# Patient Record
Sex: Male | Born: 1995 | Hispanic: Refuse to answer | Marital: Single
Health system: Midwestern US, Community
[De-identification: ages and names within clinical notes are randomized; demographics above are authoritative.]

## PROBLEM LIST (undated history)

## (undated) DIAGNOSIS — K219 Gastro-esophageal reflux disease without esophagitis: Secondary | ICD-10-CM

## (undated) DIAGNOSIS — K439 Ventral hernia without obstruction or gangrene: Secondary | ICD-10-CM

## (undated) DIAGNOSIS — J302 Other seasonal allergic rhinitis: Secondary | ICD-10-CM

## (undated) HISTORY — DX: Ventral hernia without obstruction or gangrene: K43.9

---

## 2018-05-23 ENCOUNTER — Inpatient Hospital Stay
Admit: 2018-05-23 | Discharge: 2018-05-23 | Disposition: A | Discharge status: Home / Self Care | Payer: Self-pay | Attending: Emergency Medicine

## 2018-05-23 DIAGNOSIS — J039 Acute tonsillitis, unspecified: Secondary | ICD-10-CM

## 2018-05-23 LAB — STREP AG SCREEN, GROUP A
Group A Strep Ag ID: NEGATIVE
Strep A Ag: NEGATIVE

## 2018-05-23 MED ORDER — AMOXICILLIN 875 MG TAB
875 mg | ORAL_TABLET | Freq: Two times a day (BID) | ORAL | 0 refills | Status: AC
Start: 2018-05-23 — End: 2018-06-02

## 2018-05-23 MED ORDER — IBUPROFEN 600 MG TAB
600 mg | ORAL_TABLET | Freq: Four times a day (QID) | ORAL | 0 refills | Status: AC | PRN
Start: 2018-05-23 — End: ?

## 2018-05-23 MED ORDER — DEXAMETHASONE SODIUM PHOSPHATE 10 MG/ML IJ SOLN
10 mg/mL | Freq: Once | INTRAMUSCULAR | Status: AC
Start: 2018-05-23 — End: 2018-05-23
  Administered 2018-05-23: 19:00:00 via ORAL

## 2018-05-23 MED FILL — DEXAMETHASONE SODIUM PHOSPHATE 10 MG/ML IJ SOLN: 10 mg/mL | INTRAMUSCULAR | Qty: 1

## 2018-05-23 NOTE — ED Notes (Signed)
 Patient presents to ED with c/o sore throat for few days. Patient noted to have white pustule to bilateral tonsils. Patient is alert and oriented x 4 and in no acute distress at this time.  Respirations are at a regular rate, depth, and pattern.  Patient updated on plan of care and has no questions or concerns at this time.  Call bell within reach.  Will continue to monitor.  Please reference nursing assessment.         Emergency Department Nursing Plan of Care       The Nursing Plan of Care is developed from the Nursing assessment and Emergency Department Attending provider initial evaluation.  The plan of care may be reviewed in the "ED Provider note".    The Plan of Care was developed with the following considerations:   Patient / Family readiness to learn indicated ab:czmajopszi understanding and successful return demonstration  Persons(s) to be included in education: patient  Barriers to Learning/Limitations:No    Signed     Dionna CHRISTELLA Batter, RN    05/23/2018   1:00 PM

## 2018-05-23 NOTE — ED Notes (Signed)
Reports sore throat x few days

## 2018-05-23 NOTE — ED Provider Notes (Signed)
ED Provider Notes by Ellsworth LennoxNardella, Deondrick Searls M, NP at 05/23/18 1335                Author: Ellsworth LennoxNardella, Helio Lack M, NP  Service: Emergency Medicine  Author Type: Nurse Practitioner       Filed: 05/24/18 1503  Date of Service: 05/23/18 1335  Status: Attested           Editor: Ellsworth LennoxNardella, Toula Miyasaki M, NP (Nurse Practitioner)  Cosigner: Jule SerStratton, Dwayne E, MD at 05/25/18 1044          Attestation signed by Jule SerStratton, Dwayne E, MD at 05/25/18 1044          10:44 AM   I was personally available for consultation in the emergency department.  I have reviewed the chart and agree with the documentation recorded by the APP, including the assessment, treatment plan, and disposition.   Jule Serwayne E Stratton, MD                                    EMERGENCY DEPARTMENT HISTORY AND PHYSICAL EXAM      Date: 05/23/2018   Patient Name: Paul Gill        History of Presenting Illness          Chief Complaint       Patient presents with        ?  Sore Throat              History Provided By: Patient      Chief Complaint: sore throat   Duration: 3 Days   Timing:  Acute   Location: tonsils back of throat   Quality: irritating sensation   Severity: 5 out of 10   Modifying Factors: swallowing worsens pain   Associated Symptoms: denies any other associated signs or symptoms         HPI: Paul Gill is a  22 y.o. male with a PMH of No significant  past medical history who presents with throat acute onset 3 days ago.  Patient states he has white pustules on his tonsils.  Patient denies history of strep throat.  Patient states is painful to swallow.   Patient denies fever cough ear pain runny nose.  He denies recent sick contacts.  He has no other complaints at this time.       PCP: None        Current Outpatient Medications          Medication  Sig  Dispense  Refill           ?  ibuprofen (MOTRIN) 600 mg tablet  Take 1 Tab by mouth every six (6) hours as needed for Pain.  20 Tab  0           ?  amoxicillin (AMOXIL) 875 mg tablet  Take 1 Tab by mouth  two (2) times a day for 10 days.  20 Tab  0             Past History        Past Medical History:   No past medical history on file.      Past Surgical History:   No past surgical history on file.      Family History:   No family history on file.      Social History:     Social History  Tobacco Use         ?  Smoking status:  Not on file       Substance Use Topics         ?  Alcohol use:  Not on file         ?  Drug use:  Not on file           Allergies:   No Known Allergies           Review of Systems     Review of Systems    Constitutional: Negative for chills, fatigue and fever.    HENT: Positive for sore throat. Negative for congestion, drooling, trouble swallowing and voice change.     Eyes: Negative for redness.    Respiratory: Negative for cough, chest tightness and wheezing.     Cardiovascular: Negative for chest pain.    Gastrointestinal: Negative for abdominal pain.    Genitourinary: Negative for dysuria.    Musculoskeletal: Negative for arthralgias and back pain.    Skin: Negative for rash.    Neurological: Negative for dizziness, syncope, weakness, light-headedness, numbness and headaches.    Hematological: Negative for adenopathy.    All other systems reviewed and are negative.           Physical Exam          Vitals:          05/23/18 1223        BP:  140/78     Pulse:  79     Resp:  16     Temp:  98.7 ??F (37.1 ??C)     SpO2:  100%     Weight:  122.5 kg (270 lb)        Height:  5\' 8"  (1.727 m)        Physical Exam   Vitals signs and nursing note reviewed.   Constitutional:        Appearance: He is well-developed.   HENT :       Head: Normocephalic and atraumatic.      Right Ear: External ear normal.      Mouth/Throat:      Pharynx: Uvula midline.  Oropharyngeal exudate and posterior oropharyngeal erythema present.      Comments:  bilateral tonsillar exudate tonsils enlarged   Eyes:       General:         Right eye: No discharge.         Left eye: No discharge.      Conjunctiva/sclera:  Conjunctivae normal.    Neck:       Musculoskeletal: Normal range of motion and neck supple.   Cardiovascular :       Rate and Rhythm: Normal rate and regular rhythm.      Heart sounds: Normal heart sounds.    Pulmonary:       Effort: Pulmonary effort is normal. No respiratory distress.      Breath sounds: Normal breath sounds. No wheezing.    Abdominal:      General: Bowel sounds are normal.      Palpations: Abdomen is soft.      Tenderness: There is no tenderness.     Musculoskeletal: Normal range of motion.     Lymphadenopathy:       Cervical: No cervical adenopathy.   Skin :      General: Skin is warm and dry.   Neurological :       Mental  Status: He is alert and oriented to person, place, and time.      Cranial Nerves: No cranial nerve deficit.    Psychiatric:         Behavior: Behavior normal.         Thought Content: Thought content normal.         Judgment: Judgment normal.                  Diagnostic Study Results        Labs -    No results found for this or any previous visit (from the past 12 hour(s)).      Radiologic Studies -      No orders to display          CT Results  (Last 48 hours)          None                 CXR Results  (Last 48  hours)          None                       Medical Decision Making     I am the first provider for this patient.      I reviewed the vital signs, available nursing notes, past medical history, past surgical history, family history and social history.      Vital Signs-Reviewed the patient's vital signs.      Records Reviewed: Nursing Notes                 Disposition:   home      DISCHARGE NOTE:            Care plan outlined and precautions discussed.  Patient has no new complaints, changes, or physical findings.  . All medications were reviewed with the patient; will d/c home with PCP. All of pt's questions and concerns were addressed. Patient was instructed  and agrees to follow up with PCP, as well as to return to the ED upon further deterioration. Patient is ready  to go home.        Follow-up Information               Follow up With  Specialties  Details  Why  Contact Info              [facility]    In 1 week  If symptoms worsen  [facility] - [facility] Building   [address]   [place] [address]   [phone]                  Discharge Medication List as of 05/23/2018  1:25 PM              START taking these medications          Details        ibuprofen (MOTRIN) 600 mg tablet  Take 1 Tab by mouth every six (6) hours as needed for Pain., Normal, Disp-20 Tab, R-0               amoxicillin (AMOXIL) 875 mg tablet  Take 1 Tab by mouth two (2) times a day for 10 days., Normal, Disp-20 Tab, R-0                         Provider Notes (  Medical Decision Making):    DDX tonsillitis tonsillitis strep pharyngitis peritonsillar abscess   Procedures:   Procedures      Please note that this dictation was completed with Dragon, computer voice recognition software.  Quite often unanticipated grammatical, syntax, homophones, and other interpretive errors are inadvertently transcribed by the computer software.  Please disregard  these errors.  Additionally, please excuse any errors that have escaped final proofreading.        Diagnosis        Clinical Impression:       1.  Acute tonsillitis, unspecified etiology

## 2018-05-23 NOTE — ED Provider Notes (Signed)
EMERGENCY DEPARTMENT HISTORY AND PHYSICAL EXAM    Date: 05/23/2018  Patient Name: Paul Gill    History of Presenting Illness     Chief Complaint   Patient presents with   ??? Sore Throat         History Provided By: Patient    Chief Complaint: sore throat  Duration: 3 Days  Timing:  Acute  Location: tonsils back of throat  Quality: irritating sensation  Severity: 5 out of 10  Modifying Factors: swallowing worsens pain  Associated Symptoms: denies any other associated signs or symptoms      HPI: Paul Gill is a 22 y.o. male with a PMH of No significant past medical history who presents with throat acute onset 3 days ago.  Patient states he has white pustules on his tonsils.  Patient denies history of strep throat.  Patient states is painful to swallow.  Patient denies fever cough ear pain runny nose.  He denies recent sick contacts.  He has no other complaints at this time.     PCP: None    Current Outpatient Medications   Medication Sig Dispense Refill   ??? ibuprofen (MOTRIN) 600 mg tablet Take 1 Tab by mouth every six (6) hours as needed for Pain. 20 Tab 0   ??? amoxicillin (AMOXIL) 875 mg tablet Take 1 Tab by mouth two (2) times a day for 10 days. 20 Tab 0       Past History     Past Medical History:  No past medical history on file.    Past Surgical History:  No past surgical history on file.    Family History:  No family history on file.    Social History:  Social History     Tobacco Use   ??? Smoking status: Not on file   Substance Use Topics   ??? Alcohol use: Not on file   ??? Drug use: Not on file       Allergies:  No Known Allergies      Review of Systems   Review of Systems   Constitutional: Negative for chills, fatigue and fever.   HENT: Positive for sore throat. Negative for congestion, drooling, trouble swallowing and voice change.    Eyes: Negative for redness.   Respiratory: Negative for cough, chest tightness and wheezing.    Cardiovascular: Negative for chest pain.    Gastrointestinal: Negative for abdominal pain.   Genitourinary: Negative for dysuria.   Musculoskeletal: Negative for arthralgias and back pain.   Skin: Negative for rash.   Neurological: Negative for dizziness, syncope, weakness, light-headedness, numbness and headaches.   Hematological: Negative for adenopathy.   All other systems reviewed and are negative.      Physical Exam     Vitals:    05/23/18 1223   BP: 140/78   Pulse: 79   Resp: 16   Temp: 98.7 ??F (37.1 ??C)   SpO2: 100%   Weight: 122.5 kg (270 lb)   Height: 5\' 8"  (1.727 m)     Physical Exam  Vitals signs and nursing note reviewed.   Constitutional:       Appearance: He is well-developed.   HENT:      Head: Normocephalic and atraumatic.      Right Ear: External ear normal.      Mouth/Throat:      Pharynx: Uvula midline. Oropharyngeal exudate and posterior oropharyngeal erythema present.      Comments: bilateral tonsillar exudate tonsils enlarged  Eyes:  General:         Right eye: No discharge.         Left eye: No discharge.      Conjunctiva/sclera: Conjunctivae normal.   Neck:      Musculoskeletal: Normal range of motion and neck supple.   Cardiovascular:      Rate and Rhythm: Normal rate and regular rhythm.      Heart sounds: Normal heart sounds.   Pulmonary:      Effort: Pulmonary effort is normal. No respiratory distress.      Breath sounds: Normal breath sounds. No wheezing.   Abdominal:      General: Bowel sounds are normal.      Palpations: Abdomen is soft.      Tenderness: There is no tenderness.   Musculoskeletal: Normal range of motion.   Lymphadenopathy:      Cervical: No cervical adenopathy.   Skin:     General: Skin is warm and dry.   Neurological:      Mental Status: He is alert and oriented to person, place, and time.      Cranial Nerves: No cranial nerve deficit.   Psychiatric:         Behavior: Behavior normal.         Thought Content: Thought content normal.         Judgment: Judgment normal.           Diagnostic Study Results      Labs -   No results found for this or any previous visit (from the past 12 hour(s)).    Radiologic Studies -   No orders to display     CT Results  (Last 48 hours)    None        CXR Results  (Last 48 hours)    None            Medical Decision Making   I am the first provider for this patient.    I reviewed the vital signs, available nursing notes, past medical history, past surgical history, family history and social history.    Vital Signs-Reviewed the patient's vital signs.    Records Reviewed: Nursing Notes            Disposition:  home    DISCHARGE NOTE:         Care plan outlined and precautions discussed.  Patient has no new complaints, changes, or physical findings.  . All medications were reviewed with the patient; will d/c home with PCP. All of pt's questions and concerns were addressed. Patient was instructed and agrees to follow up with PCP, as well as to return to the ED upon further deterioration. Patient is ready to go home.    Follow-up Information     Follow up With Specialties Details Why Contact Info    [facility]  In 1 week If symptoms worsen [facility]  [address], [address]  [place] [id number]  [phone]          Discharge Medication List as of 05/23/2018  1:25 PM      START taking these medications    Details   ibuprofen (MOTRIN) 600 mg tablet Take 1 Tab by mouth every six (6) hours as needed for Pain., Normal, Disp-20 Tab, R-0      amoxicillin (AMOXIL) 875 mg tablet Take 1 Tab by mouth two (2) times a day for 10 days., Normal, Disp-20  Tab, R-0             Provider Notes (Medical Decision Making):   DDX tonsillitis tonsillitis strep pharyngitis peritonsillar abscess  Procedures:  Procedures    Please note that this dictation was completed with Dragon, computer voice recognition software.  Quite often unanticipated grammatical, syntax, homophones, and other interpretive errors are inadvertently transcribed by  the computer software.  Please disregard these errors.  Additionally, please excuse any errors that have escaped final proofreading.    Diagnosis     Clinical Impression:   1. Acute tonsillitis, unspecified etiology

## 2018-05-23 NOTE — ED Notes (Signed)
Patient presents to ED with c/o sore throat for few days. Patient noted to have white pustule to bilateral tonsils. Patient is alert and oriented x 4 and in no acute distress at this time.  Respirations are at a regular rate, depth, and pattern.  Patient updated on plan of care and has no questions or concerns at this time.  Call bell within reach.  Will continue to monitor.  Please reference nursing assessment.         Emergency Department Nursing Plan of Care       The Nursing Plan of Care is developed from the Nursing assessment and Emergency Department Attending provider initial evaluation.  The plan of care may be reviewed in the ???ED Provider note???.    The Plan of Care was developed with the following considerations:   Patient / Family readiness to learn indicated by:verbalized understanding and successful return demonstration  Persons(s) to be included in education: patient  Barriers to Learning/Limitations:No    Signed     Dionna M Coleman, RN    05/23/2018   1:00 PM

## 2018-05-23 NOTE — ED Triage Notes (Signed)
Reports sore throat x few days

## 2018-05-25 LAB — CULTURE, THROAT
Culture result:: NORMAL
Culture: NORMAL

## 2019-01-13 ENCOUNTER — Encounter: Attending: Family

## 2020-07-14 ENCOUNTER — Ambulatory Visit (HOSPITAL_COMMUNITY)
Admission: EM | Admit: 2020-07-14 | Discharge: 2020-07-14 | Disposition: A | Discharge status: Home / Self Care | Payer: Medicaid - Out of State | Attending: Medical Oncology | Admitting: Medical Oncology

## 2020-07-14 ENCOUNTER — Other Ambulatory Visit: Payer: Self-pay

## 2020-07-14 ENCOUNTER — Ambulatory Visit (INDEPENDENT_AMBULATORY_CARE_PROVIDER_SITE_OTHER): Payer: Medicaid - Out of State

## 2020-07-14 ENCOUNTER — Encounter (HOSPITAL_COMMUNITY): Payer: Self-pay

## 2020-07-14 DIAGNOSIS — U071 COVID-19: Secondary | ICD-10-CM | POA: Diagnosis not present

## 2020-07-14 DIAGNOSIS — R509 Fever, unspecified: Secondary | ICD-10-CM

## 2020-07-14 DIAGNOSIS — R058 Other specified cough: Secondary | ICD-10-CM | POA: Diagnosis not present

## 2020-07-14 MED ORDER — AMOXICILLIN-POT CLAVULANATE 875-125 MG PO TABS: 1.0000 | ORAL_TABLET | Freq: Two times a day (BID) | ORAL | 0 refills | Status: DC

## 2020-07-14 NOTE — ED Triage Notes (Addendum)
Pt in with c/o cough and excessive mucus production that has been going on for about 2 weeks. Also c/o body aches and fatigue. Pt tested positive for covid on   Also c/o nausea when he wakes up in the morning  Pt has been taking theraflu with no relief

## 2020-07-14 NOTE — ED Provider Notes (Addendum)
[facility]    CSN: [id number] Arrival date & time: 07/14/20  0932      History   Chief Complaint Chief Complaint  Patient presents with  . Cough  . covid + on 2/11    HPI Lance Jensen is a 25 y.o. male.   HPI   Cough: Pt reports that he began having COVID symptoms on 07/03/2020. He tested positive on 07/07/2020. Since then his symptoms initially improved and then recently over the pas few days have  worsened. He has had a productive cough with occasional speckled hemoptysis events, body aches, fever, cough. He denies chest pains, SOB, calf pain. He has been using theraflu with no relief.   History reviewed. No pertinent past medical history.  There are no problems to display for this patient.   History reviewed. No pertinent surgical history.   Home Medications    Prior to Admission medications   Medication Sig Start Date End Date Taking? Authorizing Provider  amoxicillin-clavulanate (AUGMENTIN) 875-125 MG tablet Take 1 tablet by mouth every 12 (twelve) hours. 07/14/20  Yes Rushie Chestnut, PA-C    Family History History reviewed. No pertinent family history.  Social History Social History   Tobacco Use  . Smoking status: Current Every Day Smoker  . Smokeless tobacco: Never Used  Substance Use Topics  . Drug use: Yes    Types: Marijuana     Allergies   Patient has no known allergies.   Review of Systems Review of Systems  As stated above in HPI Physical Exam Triage Vital Signs ED Triage Vitals  Enc Vitals Group     BP 07/14/20 0954 (!) 151/89     Pulse Rate 07/14/20 0954 77     Resp 07/14/20 0954 19     Temp 07/14/20 0954 99.5 F (37.5 C)     Temp src --      SpO2 07/14/20 0954 95 %     Weight --      Height --      Head Circumference --      Peak Flow --      Pain Score 07/14/20 0950 0     Pain Loc --      Pain Edu? --      Excl. in GC? --    No data found.  Updated Vital Signs BP (!) 151/89   Pulse 77   Temp 99.5 F  (37.5 C)   Resp 19   SpO2 95%   Physical Exam Vitals and nursing note reviewed.  Constitutional:      General: He is not in acute distress.    Appearance: Normal appearance. He is obese. He is not ill-appearing, toxic-appearing or diaphoretic.  HENT:     Head: Normocephalic.     Nose: Nose normal. No congestion or rhinorrhea.     Mouth/Throat:     Mouth: Mucous membranes are moist.     Pharynx: Oropharynx is clear. No oropharyngeal exudate or posterior oropharyngeal erythema.  Eyes:     Pupils: Pupils are equal, round, and reactive to light.  Cardiovascular:     Rate and Rhythm: Normal rate and regular rhythm.     Heart sounds: Normal heart sounds.  Pulmonary:     Effort: Pulmonary effort is normal. No respiratory distress.     Breath sounds: Normal breath sounds. No stridor. No wheezing, rhonchi or rales.  Chest:     Chest wall: No tenderness.  Musculoskeletal:     Cervical back: Normal  range of motion.  Lymphadenopathy:     Cervical: No cervical adenopathy.  Neurological:     Mental Status: He is alert.      UC Treatments / Results  Labs (all labs ordered are listed, but only abnormal results are displayed) Labs Reviewed - No data to display  EKG   Radiology DG Chest 2 View  Result Date: 07/14/2020 CLINICAL DATA:  Productive cough, fever. EXAM: CHEST - 2 VIEW COMPARISON:  None. FINDINGS: The heart size and mediastinal contours are within normal limits. Both lungs are clear. The visualized skeletal structures are unremarkable. IMPRESSION: No active cardiopulmonary disease. Electronically Signed   By: Lupita Raider M.D.   On: 07/14/2020 11:26    Procedures Procedures (including critical care time)  Medications Ordered in UC Medications - No data to display  Initial Impression / Assessment and Plan / UC Course  I have reviewed the triage vital signs and the nursing notes.  Pertinent labs & imaging results that were available during my care of the patient  were reviewed by me and considered in my medical decision making (see chart for details).     New. Chest x ray pending.   Update: Initial review of x ray suggests CAP. Treating with Augmentin.  Discussed how to take along with common potential side effects and precautions.  We reviewed red flag symptoms along with O2 monitoring.  Update: Official read does not suggest pneumonia however with his symptoms I am concerned about this as a possibility along with the finding on x ray in the [facility]. Treating with Augmentin.    Final Clinical Impressions(s) / UC Diagnoses   Final diagnoses:  Productive cough  COVID   Discharge Instructions   None    ED Prescriptions    Medication Sig Dispense Auth. Provider   amoxicillin-clavulanate (AUGMENTIN) 875-125 MG tablet Take 1 tablet by mouth every 12 (twelve) hours. 14 tablet Rushie Chestnut, [place]     PDMP not reviewed this encounter.   Rushie Chestnut, Cordelia Poche 07/14/20 1129    Rushie Chestnut, PA-C 07/14/20 1130

## 2020-12-06 ENCOUNTER — Other Ambulatory Visit: Payer: Self-pay

## 2020-12-06 ENCOUNTER — Encounter (HOSPITAL_COMMUNITY): Payer: Self-pay

## 2020-12-06 ENCOUNTER — Ambulatory Visit (HOSPITAL_COMMUNITY)
Admission: EM | Admit: 2020-12-06 | Discharge: 2020-12-06 | Disposition: A | Discharge status: Home / Self Care | Payer: Medicaid - Out of State | Attending: Family Medicine | Admitting: Family Medicine

## 2020-12-06 DIAGNOSIS — Z202 Contact with and (suspected) exposure to infections with a predominantly sexual mode of transmission: Secondary | ICD-10-CM

## 2020-12-06 DIAGNOSIS — Z113 Encounter for screening for infections with a predominantly sexual mode of transmission: Secondary | ICD-10-CM | POA: Diagnosis present

## 2020-12-06 HISTORY — DX: Other seasonal allergic rhinitis: J30.2

## 2020-12-06 LAB — HIV ANTIBODY (ROUTINE TESTING W REFLEX): HIV Screen 4th Generation wRfx: NONREACTIVE

## 2020-12-06 MED ORDER — METRONIDAZOLE 500 MG PO TABS: 2000.0000 mg | ORAL_TABLET | Freq: Once | ORAL | 0 refills | Status: AC

## 2020-12-06 NOTE — ED Provider Notes (Signed)
[facility]    CSN: [id number] Arrival date & time: 12/06/20  2671      History   Chief Complaint Chief Complaint  Patient presents with   Exposure to STD    HPI Lance Jensen is a 25 y.o. male.   Patient presenting today requesting STD screening.  He states his partner recently tested positive for trichomonas so he wanted to come in for treatment and to get tested for everything.  He denies any penile discharge, rashes, itching, dysuria, hematuria, pelvic pain, nausea vomiting diarrhea, fevers.   Past Medical History:  Diagnosis Date   Seasonal allergies     There are no problems to display for this patient.   History reviewed. No pertinent surgical history.     Home Medications    Prior to Admission medications   Medication Sig Start Date End Date Taking? Authorizing Provider  metroNIDAZOLE (FLAGYL) 500 MG tablet Take 4 tablets (2,000 mg total) by mouth once for 1 dose. 12/06/20 12/06/20 Yes Particia Nearing, PA-C  amoxicillin-clavulanate (AUGMENTIN) 875-125 MG tablet Take 1 tablet by mouth every 12 (twelve) hours. 07/14/20   Rushie Chestnut, PA-C    Family History Family History  Problem Relation Age of Onset   Healthy Mother     Social History Social History   Tobacco Use   Smoking status: Every Day    Pack years: 0.00   Smokeless tobacco: Never  Substance Use Topics   Drug use: Yes    Types: Marijuana     Allergies   Patient has no known allergies.   Review of Systems Review of Systems Per HPI  Physical Exam Triage Vital Signs ED Triage Vitals  Enc Vitals Group     BP 12/06/20 1002 (!) 150/86     Pulse Rate 12/06/20 1002 71     Resp 12/06/20 1002 20     Temp 12/06/20 1002 98.7 F (37.1 C)     Temp Source 12/06/20 1002 Oral     SpO2 12/06/20 1002 98 %     Weight --      Height --      Head Circumference --      Peak Flow --      Pain Score 12/06/20 1003 0     Pain Loc --      Pain Edu? --      Excl. in GC? --     No data found.  Updated Vital Signs BP (!) 150/86 (BP Location: Left Arm)   Pulse 71   Temp 98.7 F (37.1 C) (Oral)   Resp 20   SpO2 98%   Visual Acuity Right Eye Distance:   Left Eye Distance:   Bilateral Distance:    Right Eye Near:   Left Eye Near:    Bilateral Near:     Physical Exam Vitals and nursing note reviewed.  Constitutional:      Appearance: Normal appearance.  HENT:     Head: Atraumatic.  Eyes:     Extraocular Movements: Extraocular movements intact.     Conjunctiva/sclera: Conjunctivae normal.  Cardiovascular:     Rate and Rhythm: Normal rate and regular rhythm.  Pulmonary:     Effort: Pulmonary effort is normal.     Breath sounds: Normal breath sounds.  Genitourinary:    Comments: GU exam deferred, self swab performed Musculoskeletal:        General: Normal range of motion.     Cervical back: Normal range of motion and neck  supple.  Skin:    General: Skin is warm and dry.  Neurological:     General: No focal deficit present.     Mental Status: He is oriented to person, place, and time.  Psychiatric:        Mood and Affect: Mood normal.        Thought Content: Thought content normal.        Judgment: Judgment normal.     UC Treatments / Results  Labs (all labs ordered are listed, but only abnormal results are displayed) Labs Reviewed  HIV ANTIBODY (ROUTINE TESTING W REFLEX)  RPR  CYTOLOGY, (ORAL, ANAL, URETHRAL) ANCILLARY ONLY    EKG   Radiology No results found.  Procedures Procedures (including critical care time)  Medications Ordered in UC Medications - No data to display  Initial Impression / Assessment and Plan / UC Course  I have reviewed the triage vital signs and the nursing notes.  Pertinent labs & imaging results that were available during my care of the patient were reviewed by me and considered in my medical decision making (see chart for details).     Given the known exposure to trichomonas, will treat with  2 g once of metronidazole.  Discussed waiting at least 7 days for resume sexual contact after treatment completed.  Cytology swab and HIV, syphilis labs all pending, will treat based on need with these results.  Safe sexual practices reviewed.  Final Clinical Impressions(s) / UC Diagnoses   Final diagnoses:  STD exposure  Screen for STD (sexually transmitted disease)  Exposure to trichomonas   Discharge Instructions   None    ED Prescriptions     Medication Sig Dispense Auth. Provider   metroNIDAZOLE (FLAGYL) 500 MG tablet Take 4 tablets (2,000 mg total) by mouth once for 1 dose. 4 tablet Particia Nearing, [place]      PDMP not reviewed this encounter.   Particia Nearing, [place] 12/06/20 1159

## 2020-12-06 NOTE — ED Triage Notes (Signed)
Pt in requesting std testing  States his partner tested positive for trichomonas  Denies any penile sx

## 2020-12-07 LAB — CYTOLOGY, (ORAL, ANAL, URETHRAL) ANCILLARY ONLY
Chlamydia: NEGATIVE
Comment: NEGATIVE
Comment: NEGATIVE
Comment: NORMAL
Neisseria Gonorrhea: NEGATIVE
Trichomonas: NEGATIVE

## 2020-12-07 LAB — RPR: RPR Ser Ql: NONREACTIVE

## 2021-01-23 ENCOUNTER — Other Ambulatory Visit: Payer: Self-pay

## 2021-01-23 ENCOUNTER — Ambulatory Visit: Payer: Medicaid Other | Attending: Obstetrics

## 2021-01-23 DIAGNOSIS — Z315 Encounter for genetic counseling: Secondary | ICD-10-CM | POA: Diagnosis not present

## 2021-01-23 NOTE — Progress Notes (Signed)
Yurem Viner is the partner of Samantha Crimes, dob 06/17/1994, and was seen with her for genetic counseling today. 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  These are recessive conditions and therefore testing was made available to [place] to determine the chance for their current pregnancy to be affected with one of these conditions.  [employer] carrier screening was ordered today.  Results will be communicated to Chibueze (or Enrique Sack if he is not available per his request) in approximately 2 weeks.  I can be reached at [phone].  Cherly Anderson, MS, CGC

## 2021-02-08 ENCOUNTER — Telehealth: Payer: Self-pay | Admitting: Obstetrics and Gynecology

## 2021-02-08 NOTE — Telephone Encounter (Signed)
I spoke with Lance Jensen, dob Sep 14, 1995, with the results of his carrier screening which was ordered due to the finding that his partner, [name] (dob 06/17/1994), is silent carrier for alpha thalassemia and is at increased risk to be carrier for Spinal muscular atrophy.  [name]'s results showed that he is also a silent carrier for alpha thalassemia.  There is a 1 in 4 chance that this pregnancy will inherit the one missing gene from each parent and be a carrier for this conditions (a-/a-).  There is a 1 in 2 chance that the baby will be a silent carrier just like both parents (aa/a-) and a 1 in 4 chance that the baby will have normal copies of this gene (aa/aa).  While carriers may have low MCV  values, they are otherwise expected to have no symptoms related to their carrier status.  [name]'s testing for SMA was negative. He has a copy number of 3, which greatly reduces the chance that he is a carrier, from 1 in 72 to approximately 1 in 3,000.  Given the  1 in 34 chance that [name] is a carrier, this means a in 1 in 864,000 chance to have a baby with this condition.  His testing for changes in the Beta globin gene (for sickle cell disease and other variants) was normal.  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  We cannot determine if the R117H is inherited on the same chromosome (in cis) as the 7T or the 9T.  If he had a child who inherited another CF variant along with the R117H/7T, this could result in no symptoms, male infertility or CF-Related Metabolic syndrome. The R117H/9T combination is not expected to increase the risk for CF.  Because [name] was negative for changes in the CF gene (95% detection, 1 in 1,201 residual risk), the chance for pregnancies that this couple has together to inherit two variants in the CF gene is estimated to be 1 in 4,804. If he were to have a child with another partner in the  future, we would recommend carrier testing of his partner for this condition.  We can be reached at [phone].  Cherly Anderson, MS, CGC

## 2021-02-09 ENCOUNTER — Other Ambulatory Visit: Payer: Self-pay

## 2021-02-09 ENCOUNTER — Encounter (HOSPITAL_COMMUNITY): Payer: Self-pay | Admitting: Emergency Medicine

## 2021-02-09 ENCOUNTER — Ambulatory Visit (HOSPITAL_COMMUNITY)
Admission: EM | Admit: 2021-02-09 | Discharge: 2021-02-09 | Disposition: A | Discharge status: Home / Self Care | Payer: Medicaid - Out of State | Attending: Emergency Medicine | Admitting: Emergency Medicine

## 2021-02-09 DIAGNOSIS — B9689 Other specified bacterial agents as the cause of diseases classified elsewhere: Secondary | ICD-10-CM

## 2021-02-09 DIAGNOSIS — J019 Acute sinusitis, unspecified: Secondary | ICD-10-CM

## 2021-02-09 MED ORDER — AMOXICILLIN-POT CLAVULANATE 875-125 MG PO TABS: 1.0000 | ORAL_TABLET | Freq: Two times a day (BID) | ORAL | 0 refills | Status: DC

## 2021-02-09 NOTE — ED Provider Notes (Signed)
[facility]    CSN: [id number] Arrival date & time: 02/09/21  1248      History   Chief Complaint Chief Complaint  Patient presents with   Facial Pain   Nasal Congestion    HPI Lance Jensen is a 25 y.o. male.   Patient here for evaluation of facial pain and pressure that has been ongoing for the past week.  Reports using OTC medication such as Sudafed with minimal symptom relief.  Reports pressure is primarily on the right side.  Denies any recent sick contacts.  Denies any trauma, injury, or other precipitating event.  Denies any specific alleviating or aggravating factors.  Denies any fevers, chest pain, shortness of breath, N/V/D, numbness, tingling, weakness, abdominal pain, or headaches.    The history is provided by the patient.   Past Medical History:  Diagnosis Date   Seasonal allergies     There are no problems to display for this patient.   History reviewed. No pertinent surgical history.     Home Medications    Prior to Admission medications   Medication Sig Start Date End Date Taking? Authorizing Provider  amoxicillin-clavulanate (AUGMENTIN) 875-125 MG tablet Take 1 tablet by mouth every 12 (twelve) hours. 02/09/21  Yes Ivette Loyal, NP    Family History Family History  Problem Relation Age of Onset   Healthy Mother     Social History Social History   Tobacco Use   Smoking status: Every Day   Smokeless tobacco: Never  Substance Use Topics   Drug use: Yes    Types: Marijuana     Allergies   Patient has no known allergies.   Review of Systems Review of Systems  HENT:  Positive for congestion, sinus pressure and sinus pain.   All other systems reviewed and are negative.   Physical Exam Triage Vital Signs ED Triage Vitals  Enc Vitals Group     BP 02/09/21 1342 (!) 160/96     Pulse Rate 02/09/21 1342 65     Resp 02/09/21 1342 17     Temp 02/09/21 1342 98.9 F (37.2 C)     Temp Source 02/09/21 1342 Oral     SpO2  02/09/21 1342 95 %     Weight --      Height --      Head Circumference --      Peak Flow --      Pain Score 02/09/21 1341 5     Pain Loc --      Pain Edu? --      Excl. in GC? --    No data found.  Updated Vital Signs BP (!) 160/96 (BP Location: Left Wrist)   Pulse 65   Temp 98.9 F (37.2 C) (Oral)   Resp 17   SpO2 95%   Visual Acuity Right Eye Distance:   Left Eye Distance:   Bilateral Distance:    Right Eye Near:   Left Eye Near:    Bilateral Near:     Physical Exam Vitals and nursing note reviewed.  Constitutional:      General: He is not in acute distress.    Appearance: Normal appearance. He is not ill-appearing, toxic-appearing or diaphoretic.  HENT:     Head: Normocephalic and atraumatic.     Nose:     Right Turbinates: Swollen.     Left Turbinates: Swollen.     Right Sinus: Maxillary sinus tenderness and frontal sinus tenderness present.  Left Sinus: No maxillary sinus tenderness or frontal sinus tenderness.  Eyes:     Conjunctiva/sclera: Conjunctivae normal.  Cardiovascular:     Rate and Rhythm: Normal rate.     Pulses: Normal pulses.  Pulmonary:     Effort: Pulmonary effort is normal.  Abdominal:     General: Abdomen is flat.  Musculoskeletal:        General: Normal range of motion.     Cervical back: Normal range of motion.  Skin:    General: Skin is warm and dry.  Neurological:     General: No focal deficit present.     Mental Status: He is alert and oriented to person, place, and time.  Psychiatric:        Mood and Affect: Mood normal.     UC Treatments / Results  Labs (all labs ordered are listed, but only abnormal results are displayed) Labs Reviewed - No data to display  EKG   Radiology No results found.  Procedures Procedures (including critical care time)  Medications Ordered in UC Medications - No data to display  Initial Impression / Assessment and Plan / UC Course  I have reviewed the triage vital signs and the  nursing notes.  Pertinent labs & imaging results that were available during my care of the patient were reviewed by me and considered in my medical decision making (see chart for details).    Assessment negative for red flags or concerns.  Acute rhinosinusitis.  We will treat with Augmentin twice daily for the next 7 days.  May continue to use Sudafed as needed for congestion.  Recommend Flonase 1 to 2 sprays in each nostril daily.  Encourage fluids and rest.  Follow-up as needed Final Clinical Impressions(s) / UC Diagnoses   Final diagnoses:  Acute bacterial rhinosinusitis     Discharge Instructions      Take the augmentin 1 pill twice a day for the next 7 days.   You can take Tylenol and/or Ibuprofen as needed for pain relief and fever reduction.  You can use Flonase (fluticasone) 1-2 sprays in each nostril daily to help with congestion and sinus pressure.   Make sure you are drinking plenty of fluids, especially water, and rest.   Return or go to the Emergency Department if symptoms worsen or do not improve in the next few days.      ED Prescriptions     Medication Sig Dispense Auth. Provider   amoxicillin-clavulanate (AUGMENTIN) 875-125 MG tablet Take 1 tablet by mouth every 12 (twelve) hours. 14 tablet Ivette Loyal, NP      PDMP not reviewed this encounter.   Ivette Loyal, NP 02/09/21 1436

## 2021-02-09 NOTE — Discharge Instructions (Signed)
Take the augmentin 1 pill twice a day for the next 7 days.   You can take Tylenol and/or Ibuprofen as needed for pain relief and fever reduction.  You can use Flonase (fluticasone) 1-2 sprays in each nostril daily to help with congestion and sinus pressure.   Make sure you are drinking plenty of fluids, especially water, and rest.   Return or go to the Emergency Department if symptoms worsen or do not improve in the next few days.

## 2021-02-09 NOTE — ED Triage Notes (Signed)
Pt presents with facial pain and pressure and congestion xs 1 week.

## 2021-02-12 ENCOUNTER — Other Ambulatory Visit: Payer: Self-pay

## 2021-04-26 ENCOUNTER — Encounter (HOSPITAL_COMMUNITY): Payer: Self-pay | Admitting: Emergency Medicine

## 2021-04-26 ENCOUNTER — Emergency Department (HOSPITAL_COMMUNITY)
Admission: EM | Admit: 2021-04-26 | Discharge: 2021-04-26 | Disposition: A | Discharge status: Home / Self Care | Payer: Medicaid Other | Attending: Emergency Medicine | Admitting: Emergency Medicine

## 2021-04-26 DIAGNOSIS — J101 Influenza due to other identified influenza virus with other respiratory manifestations: Secondary | ICD-10-CM | POA: Insufficient documentation

## 2021-04-26 DIAGNOSIS — Z20822 Contact with and (suspected) exposure to covid-19: Secondary | ICD-10-CM | POA: Diagnosis not present

## 2021-04-26 DIAGNOSIS — Z5321 Procedure and treatment not carried out due to patient leaving prior to being seen by health care provider: Secondary | ICD-10-CM | POA: Diagnosis not present

## 2021-04-26 DIAGNOSIS — R059 Cough, unspecified: Secondary | ICD-10-CM | POA: Diagnosis present

## 2021-04-26 LAB — RESP PANEL BY RT-PCR (FLU A&B, COVID) ARPGX2
Influenza A by PCR: POSITIVE — AB
Influenza B by PCR: NEGATIVE
SARS Coronavirus 2 by RT PCR: NEGATIVE

## 2021-04-26 NOTE — ED Triage Notes (Signed)
Patient complains of productive cough and headache since last Friday. Patient also complains of right sided chest pain after coughing. Patient alert, oriented, and in no apparent distress at this time.

## 2021-04-29 ENCOUNTER — Ambulatory Visit (HOSPITAL_COMMUNITY)
Admission: EM | Admit: 2021-04-29 | Discharge: 2021-04-29 | Disposition: A | Discharge status: Home / Self Care | Payer: Medicaid Other | Attending: Student | Admitting: Student

## 2021-04-29 ENCOUNTER — Other Ambulatory Visit: Payer: Self-pay

## 2021-04-29 ENCOUNTER — Encounter (HOSPITAL_COMMUNITY): Payer: Self-pay | Admitting: Emergency Medicine

## 2021-04-29 DIAGNOSIS — J09X2 Influenza due to identified novel influenza A virus with other respiratory manifestations: Secondary | ICD-10-CM

## 2021-04-29 DIAGNOSIS — R0789 Other chest pain: Secondary | ICD-10-CM

## 2021-04-29 MED ORDER — TIZANIDINE HCL 2 MG PO TABS: 2.0000 mg | ORAL_TABLET | Freq: Three times a day (TID) | ORAL | 0 refills | Status: DC | PRN

## 2021-04-29 NOTE — ED Provider Notes (Signed)
[facility]    CSN: [id number] Arrival date & time: 04/29/21  1431      History   Chief Complaint Chief Complaint  Patient presents with   rib pain    HPI Lance Jensen is a 25 y.o. male presenting with R rib pain due to coughing. States he was diagnosed with influenza 1 week ago in the ED- didn't stay to be evaluated. R lateral rib pain with coughing and movement. Lingering nonproductive cough. Denies SOB, CP, dizziness, fevers/chills. Has attempted tylenol with minimal relief. Requesting work note.  HPI  Past Medical History:  Diagnosis Date   Seasonal allergies     There are no problems to display for this patient.   History reviewed. No pertinent surgical history.     Home Medications    Prior to Admission medications   Medication Sig Start Date End Date Taking? Authorizing Provider  tiZANidine (ZANAFLEX) 2 MG tablet Take 1 tablet (2 mg total) by mouth every 8 (eight) hours as needed for muscle spasms. 04/29/21  Yes Rhys Martini, PA-C    Family History Family History  Problem Relation Age of Onset   Healthy Mother     Social History Social History   Tobacco Use   Smoking status: Former    Types: Cigarettes   Smokeless tobacco: Never  Vaping Use   Vaping Use: Never used  Substance Use Topics   Alcohol use: Never   Drug use: Not Currently    Types: Marijuana     Allergies   Patient has no known allergies.   Review of Systems Review of Systems  Musculoskeletal:        R rib pain  All other systems reviewed and are negative.   Physical Exam Triage Vital Signs ED Triage Vitals  Enc Vitals Group     BP 04/29/21 1715 (!) 146/79     Pulse Rate 04/29/21 1715 98     Resp 04/29/21 1715 (!) 22     Temp 04/29/21 1715 98.1 F (36.7 C)     Temp Source 04/29/21 1715 Oral     SpO2 04/29/21 1715 95 %     Weight --      Height --      Head Circumference --      Peak Flow --      Pain Score 04/29/21 1711 6     Pain Loc --      Pain  Edu? --      Excl. in GC? --    No data found.  Updated Vital Signs BP (!) 146/79 (BP Location: Left Arm) Comment (BP Location): large cuff  Pulse 98   Temp 98.1 F (36.7 C) (Oral)   Resp (!) 22   SpO2 95%   Visual Acuity Right Eye Distance:   Left Eye Distance:   Bilateral Distance:    Right Eye Near:   Left Eye Near:    Bilateral Near:     Physical Exam Vitals reviewed.  Constitutional:      General: He is not in acute distress.    Appearance: Normal appearance. He is not ill-appearing.  HENT:     Head: Normocephalic and atraumatic.  Pulmonary:     Effort: Pulmonary effort is normal.     Breath sounds: No decreased breath sounds, wheezing, rhonchi or rales.     Comments: Breath sounds clear throughout  Musculoskeletal:     Comments: Exam limited due to body habitus. R lateral ribs-TTP without point tenderness  or effusion.   Neurological:     General: No focal deficit present.     Mental Status: He is alert and oriented to person, place, and time.  Psychiatric:        Mood and Affect: Mood normal.        Behavior: Behavior normal.        Thought Content: Thought content normal.        Judgment: Judgment normal.     UC Treatments / Results  Labs (all labs ordered are listed, but only abnormal results are displayed) Labs Reviewed - No data to display  EKG   Radiology No results found.  Procedures Procedures (including critical care time)  Medications Ordered in UC Medications - No data to display  Initial Impression / Assessment and Plan / UC Course  I have reviewed the triage vital signs and the nursing notes.  Pertinent labs & imaging results that were available during my care of the patient were reviewed by me and considered in my medical decision making (see chart for details).     This patient is a very pleasant 25 y.o. year old male presenting with MSK strain related to coughing. Reassuring exam. Today this pt is afebrile nontachycardic  nontachypneic, oxygenating well on room air, no wheezes rhonchi or rales.   Zanaflex sent. Trial of ibuprofen.   Work note provided. ED return precautions discussed. Patient verbalizes understanding and agreement.  .   Final Clinical Impressions(s) / UC Diagnoses   Final diagnoses:  Chest wall pain  Influenza due to identified novel influenza A virus with other respiratory manifestations     Discharge Instructions      -Start the muscle relaxer-Zanaflex (tizanidine), up to 3 times daily for muscle spasms and pain.  This can make you drowsy, so take at bedtime or when you do not need to drive or operate machinery. -You can take Tylenol up to 1000 mg 3 times daily, and ibuprofen up to 800 mg 3 times daily with food.  You can take these together, or alternate every 3-4 hours. -Heating pad -Continue over-the-counter medications for additional relief    ED Prescriptions     Medication Sig Dispense Auth. Provider   tiZANidine (ZANAFLEX) 2 MG tablet Take 1 tablet (2 mg total) by mouth every 8 (eight) hours as needed for muscle spasms. 21 tablet Rhys Martini, PA-C      PDMP not reviewed this encounter.   Rhys Martini, PA-C 04/29/21 1732

## 2021-04-29 NOTE — ED Triage Notes (Signed)
Right rib pain that started the weekend after thanks giving.  Patient went to ED on 04/26/2021.  Patient was diagnosed with influenza.  Patient did leave ED AMA due to family emergency

## 2021-04-29 NOTE — Discharge Instructions (Addendum)
-  Start the muscle relaxer-Zanaflex (tizanidine), up to 3 times daily for muscle spasms and pain.  This can make you drowsy, so take at bedtime or when you do not need to drive or operate machinery. -You can take Tylenol up to 1000 mg 3 times daily, and ibuprofen up to 800 mg 3 times daily with food.  You can take these together, or alternate every 3-4 hours. -Heating pad -Continue over-the-counter medications for additional relief

## 2022-01-26 ENCOUNTER — Emergency Department (HOSPITAL_COMMUNITY)
Admission: EM | Admit: 2022-01-26 | Discharge: 2022-01-26 | Disposition: A | Discharge status: Home / Self Care | Payer: Medicaid Other | Attending: Emergency Medicine | Admitting: Emergency Medicine

## 2022-01-26 ENCOUNTER — Encounter (HOSPITAL_COMMUNITY): Payer: Self-pay | Admitting: Emergency Medicine

## 2022-01-26 DIAGNOSIS — R7401 Elevation of levels of liver transaminase levels: Secondary | ICD-10-CM | POA: Diagnosis not present

## 2022-01-26 DIAGNOSIS — K429 Umbilical hernia without obstruction or gangrene: Secondary | ICD-10-CM | POA: Insufficient documentation

## 2022-01-26 DIAGNOSIS — R109 Unspecified abdominal pain: Secondary | ICD-10-CM | POA: Diagnosis present

## 2022-01-26 LAB — COMPREHENSIVE METABOLIC PANEL
ALT: 48 U/L — ABNORMAL HIGH (ref 0–44)
AST: 29 U/L (ref 15–41)
Albumin: 3.8 g/dL (ref 3.5–5.0)
Alkaline Phosphatase: 87 U/L (ref 38–126)
Anion gap: 12 (ref 5–15)
BUN: 9 mg/dL (ref 6–20)
CO2: 27 mmol/L (ref 22–32)
Calcium: 9.5 mg/dL (ref 8.9–10.3)
Chloride: 101 mmol/L (ref 98–111)
Creatinine, Ser: 0.65 mg/dL (ref 0.61–1.24)
GFR, Estimated: >60 mL/min (ref >60)
Glucose, Bld: 98 mg/dL (ref 70–99)
Potassium: 4.1 mmol/L (ref 3.5–5.1)
Sodium: 140 mmol/L (ref 135–145)
Total Bilirubin: 0.5 mg/dL (ref 0.3–1.2)
Total Protein: 6.4 g/dL — ABNORMAL LOW (ref 6.5–8.1)

## 2022-01-26 LAB — CBC
HCT: 40 % (ref 39.0–52.0)
Hemoglobin: 13 g/dL (ref 13.0–17.0)
MCH: 26.4 pg (ref 26.0–34.0)
MCHC: 32.5 g/dL (ref 30.0–36.0)
MCV: 81.1 fL (ref 80.0–100.0)
Platelets: 271 10*3/uL (ref 150–400)
RBC: 4.93 MIL/uL (ref 4.22–5.81)
RDW: 13.4 % (ref 11.5–15.5)
WBC: 9 10*3/uL (ref 4.0–10.5)
nRBC: 0 % (ref 0.0–0.2)

## 2022-01-26 LAB — URINALYSIS, ROUTINE W REFLEX MICROSCOPIC
Bilirubin Urine: NEGATIVE
Glucose, UA: NEGATIVE mg/dL
Hgb urine dipstick: NEGATIVE
Ketones, ur: NEGATIVE mg/dL
Leukocytes,Ua: NEGATIVE
Nitrite: NEGATIVE
Protein, ur: NEGATIVE mg/dL
Specific Gravity, Urine: 1.021 (ref 1.005–1.030)
pH: 5 (ref 5.0–8.0)

## 2022-01-26 LAB — LIPASE, BLOOD: Lipase: 38 U/L (ref 11–51)

## 2022-01-26 NOTE — ED Notes (Signed)
Patient verbalizes understanding of discharge instructions. Opportunity for questioning and answers were provided. Pt discharged from ED. 

## 2022-01-26 NOTE — ED Triage Notes (Signed)
Pt endorses mid abd pain to his abd for a few days that worsened yesterday. Sharp pain that radiates up, worse with movement and after eating. Reports a hx of hernia but feels like it has gotten bigger.

## 2022-01-26 NOTE — Discharge Instructions (Addendum)
You were seen in the ER for evaluation of your umbilical abdominal pain.  I likely think this is a reducible umbilical hernia.  Your labs are reassuring.  Ultimately, you will need to follow-up with a general surgeon.  I included the information for when into this discharge paperwork.  Please call to schedule an appointment.  If you have any nausea, vomiting, unable to pass gas, constipation, rash or redness to your belly, please return to the ER for re-evaluation.   Contact a doctor if: You get new pain, swelling, or redness near your hernia. You poop fewer times in a week than normal. You have trouble pooping. You have poop that is more dry than normal. You have poop that is harder or larger than normal. Get help right away if: You have a fever or chills. You have belly pain that gets worse. You feel like you may vomit, or you vomit. Your hernia cannot be pushed in by gently pressing on it when you are lying down. Your hernia: Changes in shape or size. Changes color. Feels hard, or it hurts when you touch it. These symptoms may be an emergency. Get help right away. Call your local emergency services (911 in the [place] Do not wait to see if the symptoms will go away. Do not drive yourself to the hospital.

## 2022-01-26 NOTE — ED Provider Notes (Signed)
[facility] EMERGENCY DEPARTMENT Provider Note   CSN: [id number] Arrival date & time: 01/26/22  1522     History Chief Complaint  Patient presents with   Abdominal Pain    Lance Jensen is a 26 y.o. male with history of umbilical hernia presents the emergency room for evaluation of pain at his hernia site for the past 2 days.  Patient reports it hurts more when he is upright and walking.  He feels like that pain, radiates mildly into his upper abdomen and mildly left to right as well.  He denies any nausea, vomiting, diarrhea, constipation, urinary symptoms.  Patient reports he is able to pass gas.  Denies any chest pain or SOB. no medications taken prior to arrival.  Patient reports that he has had this hernia his whole life and has not seen a general surgeon for this.  Denies any rashes or new trauma to the area.  No known drug allergies.   Abdominal Pain Associated symptoms: no chest pain, no constipation, no diarrhea, no dysuria, no fever, no hematuria, no nausea, no shortness of breath and no vomiting        Home Medications Prior to Admission medications   Medication Sig Start Date End Date Taking? Authorizing Provider  tiZANidine (ZANAFLEX) 2 MG tablet Take 1 tablet (2 mg total) by mouth every 8 (eight) hours as needed for muscle spasms. 04/29/21   Rhys Martini, PA-C      Allergies    Patient has no known allergies.    Review of Systems   Review of Systems  Constitutional:  Negative for fever.  Respiratory:  Negative for shortness of breath.   Cardiovascular:  Negative for chest pain.  Gastrointestinal:  Positive for abdominal pain. Negative for constipation, diarrhea, nausea and vomiting.  Genitourinary:  Negative for dysuria and hematuria.  Musculoskeletal:  Negative for back pain and neck pain.  Neurological:  Negative for syncope and headaches.    Physical Exam Updated Vital Signs BP 126/65   Pulse 77   Temp 98.4 F (36.9 C) (Oral)   Resp  16   Ht 5\' 8"  (1.727 m)   Wt (!) 164.7 kg   SpO2 97%   BMI 55.19 kg/m  Physical Exam Vitals and nursing note reviewed.  Constitutional:      General: He is not in acute distress.    Appearance: Normal appearance. He is not ill-appearing or toxic-appearing.  HENT:     Head: Normocephalic and atraumatic.  Eyes:     General: No scleral icterus. Pulmonary:     Effort: Pulmonary effort is normal. No respiratory distress.  Abdominal:     General: Bowel sounds are normal. There is no distension.     Palpations: Abdomen is soft.     Tenderness: There is no abdominal tenderness. There is no guarding or rebound.     Hernia: A hernia is present. Hernia is present in the umbilical area.     Comments: Just superior to the patient's umbilicus, there is a soft, reducible hernia around the size of a chicken egg.  No overlying skin changes noted.  Abdomen is soft and nontender to soft or deep palpation.  Normal active bowel sounds.  Abdominal exam is limited secondary to body habitus.  Hernia is only palpated upon patient standing.  Unable to palpate lying in the supine position.  Musculoskeletal:        General: No deformity.     Cervical back: Normal range of motion.  Skin:    General: Skin is warm and dry.  Neurological:     General: No focal deficit present.     Mental Status: He is alert. Mental status is at baseline.     ED Results / Procedures / Treatments   Labs (all labs ordered are listed, but only abnormal results are displayed) Labs Reviewed  COMPREHENSIVE METABOLIC PANEL - Abnormal; Notable for the following components:      Result Value   Total Protein 6.4 (*)    ALT 48 (*)    All other components within normal limits  URINALYSIS, ROUTINE W REFLEX MICROSCOPIC - Abnormal; Notable for the following components:   APPearance HAZY (*)    All other components within normal limits  LIPASE, BLOOD  CBC    EKG None  Radiology No results found.  Procedures Procedures     Medications Ordered in ED Medications - No data to display  ED Course/ Medical Decision Making/ A&P                           Medical Decision Making Amount and/or Complexity of Data Reviewed Labs: ordered.   26 year old male presents the emergency room for evaluation of hernia pain for the past few days.  Differential diagnosis includes was not limited to small bowel obstruction, cellulitis overlying hernia, lymphadenopathy, incarcerated hernia, strangulated hernia, reducible hernia.  Vital signs are unremarkable.  Physical exam as noted above.  Labs ordered in triage.  I independently reviewed and interpreted the patient's labs.  CBC without leukocytosis or anemia.  Lipase normal.  Urinalysis shows hazy urine otherwise normal.  CMP shows mild decrease in total protein at 6.4.  Mild increase in ALT at 48 otherwise no other electrolyte or LFT abnormalities.  Given that the hernia is soft and reducible, I do not think this needs any immediate surgical intervention. The patient has normal bowel movements, is able to pass flatulence.  He does not have any tenderness upon palpation of the abdomen. I have a low decision for any small bowel obstruction or incarcerated hernia given that the hernia is soft and reducible and he is having normal bowel movements and able to pass gas.  My attending assessed at bedside and agrees.  He think this patient can be discharged home with outpatient surgery follow-up.  I discussed the patient's labs with him.  We discussed return precautions red flag symptoms.  Discussed the need to he will need to follow-up with outpatient surgery for an appointment for possible repair of this.  Patient verbalizes understanding and agrees to the plan.  Patient is stable and being discharged home in good condition.  I discussed this case with my attending physician who cosigned this note including patient's presenting symptoms, physical exam, and planned diagnostics and  interventions. Attending physician stated agreement with plan or made changes to plan which were implemented.   Attending physician assessed patient at bedside.   Final Clinical Impression(s) / ED Diagnoses Final diagnoses:  Umbilical hernia without obstruction and without gangrene    Rx / DC Orders ED Discharge Orders     None         Achille Rich, PA-C 01/26/22 1759    Ernie Avena, MD 01/26/22 2103

## 2022-05-29 ENCOUNTER — Ambulatory Visit
Admission: EM | Admit: 2022-05-29 | Discharge: 2022-05-29 | Disposition: A | Discharge status: Home / Self Care | Payer: Medicaid Other | Attending: Physician Assistant | Admitting: Physician Assistant

## 2022-05-29 DIAGNOSIS — K4090 Unilateral inguinal hernia, without obstruction or gangrene, not specified as recurrent: Secondary | ICD-10-CM

## 2022-05-29 DIAGNOSIS — R1033 Periumbilical pain: Secondary | ICD-10-CM

## 2022-05-29 MED ORDER — ONDANSETRON HCL 4 MG PO TABS: 4.0000 mg | ORAL_TABLET | Freq: Three times a day (TID) | ORAL | 0 refills | Status: DC | PRN

## 2022-05-29 NOTE — ED Triage Notes (Signed)
Pt reports being dx with umbilical hernia at ED on 09/02. Has had increased episodes of pain since then, especially with bending over. Significant increase in pain today after sneezing. Has improved some in the last hour.  Also concerned with soft stools since September.   Hernia sometimes visible and is approximately the size of an egg.   Was referred to general surgery but his insurance doesn't cover that provider.

## 2022-05-29 NOTE — Discharge Instructions (Signed)
Internal referral has been made to [facility].  Their office should be calling you within the next 48 hours to arrange an appointment to have the hernia evaluated.  Advised no heavy lifting over the next 10 days until evaluated by the surgeon and the hernia is repaired.  Advised to report to ER if symptoms worsen follow-up with vomiting and difficulty with bowel movements.

## 2022-05-29 NOTE — ED Provider Notes (Signed)
[facility]    CSN: [id number] Arrival date & time: 05/29/22  1433      History   Chief Complaint Chief Complaint  Patient presents with   Hernia    HPI Lance Jensen is a 27 y.o. male.   27 year old male presents with abdominal hernia.  Patient indicates on 01/26/2022 he was diagnosed with a umbilical hernia.  Patient indicates that he does a lot of lifting, bending, and reaching during his workday.  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  Patient indicates for the past several weeks he has been having some increased nausea without vomiting, abdominal pain and discomfort around the hernia area, without fever or chills.  The patient also indicates that he has been having some soft stools on a regular basis since the hernia has occurred.  Patient indicates that the hernia is worse when he tends to strain and lift heavy objects, the hernia tends to improve and resolves when he is lying flat and relaxing.  Patient denies any fever, or chills.  Patient indicates he did have an appointment for a surgical consult however the surgical group that he was referred to does not take his insurance.     Past Medical History:  Diagnosis Date   Seasonal allergies     There are no problems to display for this patient.   History reviewed. No pertinent surgical history.     Home Medications    Prior to Admission medications   Medication Sig Start Date End Date Taking? Authorizing Provider  ondansetron (ZOFRAN) 4 MG tablet Take 1 tablet (4 mg total) by mouth every 8 (eight) hours as needed for nausea or vomiting. 05/29/22  Yes Nyoka Lint, PA-C  tiZANidine (ZANAFLEX) 2 MG tablet Take 1 tablet (2 mg total) by mouth every 8 (eight) hours as needed for muscle spasms. 04/29/21   Hazel Sams, PA-C    Family History Family History  Problem Relation Age of Onset   Healthy Mother     Social History Social History    Tobacco Use   Smoking status: Former    Types: Cigarettes   Smokeless tobacco: Never  Vaping Use   Vaping Use: Never used  Substance Use Topics   Alcohol use: Never   Drug use: Not Currently    Types: Marijuana     Allergies   Patient has no known allergies.   Review of Systems Review of Systems  Gastrointestinal:  Positive for abdominal pain (umbilical area).     Physical Exam Triage Vital Signs ED Triage Vitals  Enc Vitals Group     BP 05/29/22 1621 123/75     Pulse Rate 05/29/22 1621 76     Resp 05/29/22 1621 20     Temp 05/29/22 1621 98.3 F (36.8 C)     Temp Source 05/29/22 1621 Oral     SpO2 05/29/22 1621 96 %     Weight --      Height --      Head Circumference --      Peak Flow --      Pain Score 05/29/22 1619 4     Pain Loc --      Pain Edu? --      Excl. in [place]? --    No data found.  Updated Vital Signs BP 123/75 (BP Location: Left Arm)   Pulse 76   Temp 98.3 F (36.8 C) (Oral)  Resp 20   SpO2 96%   Visual Acuity Right Eye Distance:   Left Eye Distance:   Bilateral Distance:    Right Eye Near:   Left Eye Near:    Bilateral Near:     Physical Exam Constitutional:      Appearance: Normal appearance.  Cardiovascular:     Rate and Rhythm: Normal rate and regular rhythm.     Heart sounds: Normal heart sounds.  Pulmonary:     Effort: Pulmonary effort is normal.     Breath sounds: Normal breath sounds and air entry. No wheezing, rhonchi or rales.  Abdominal:     General: Abdomen is flat. Bowel sounds are normal.     Palpations: Abdomen is soft.     Tenderness: There is abdominal tenderness in the periumbilical area. There is no guarding or rebound.       Comments: Abdomen: There is a 3 x 2 cm direct hernia which is an inch and a half above the umbilicus, there is pain on palpation, the area is soft and easily reduces.  There is no unusual redness or swelling around the umbilicus.  There is no drainage from the umbilicus.   Neurological:     Mental Status: He is alert.      UC Treatments / Results  Labs (all labs ordered are listed, but only abnormal results are displayed) Labs Reviewed - No data to display  EKG   Radiology No results found.  Procedures Procedures (including critical care time)  Medications Ordered in UC Medications - No data to display  Initial Impression / Assessment and Plan / UC Course  I have reviewed the triage vital signs and the nursing notes.  Pertinent labs & imaging results that were available during my care of the patient were reviewed by me and considered in my medical decision making (see chart for details).    Plan: 1.  The inguinal hernia will be treated with the following: A.  Zofran 4 mg every 6 hours if needed for nausea. B.  Internal referral has been made to [facility] for evaluation of the hernia. 2.  The abdominal pain will be treated with the following: A.  Zofran 4 mg every 6 hours as needed for nausea. B.  Advised no lifting over 5 pounds for the next several days until evaluated by the surgeon. 3.  Advised follow-up PCP or return to urgent care as needed. Final Clinical Impressions(s) / UC Diagnoses   Final diagnoses:  Periumbilical abdominal pain  Non-recurrent unilateral inguinal hernia without obstruction or gangrene     Discharge Instructions      Internal referral has been made to [facility].  Their office should be calling you within the next 48 hours to arrange an appointment to have the hernia evaluated.  Advised no heavy lifting over the next 10 days until evaluated by the surgeon and the hernia is repaired.  Advised to report to ER if symptoms worsen follow-up with vomiting and difficulty with bowel movements.    ED Prescriptions     Medication Sig Dispense Auth. Provider   ondansetron (ZOFRAN) 4 MG tablet Take 1 tablet (4 mg total) by mouth every 8 (eight) hours as needed for nausea or vomiting. 20 tablet  Nyoka Lint, PA-C      PDMP not reviewed this encounter.   Nyoka Lint, PA-C 05/29/22 1650

## 2022-06-03 ENCOUNTER — Encounter: Payer: Self-pay | Admitting: Surgery

## 2022-06-03 ENCOUNTER — Ambulatory Visit (INDEPENDENT_AMBULATORY_CARE_PROVIDER_SITE_OTHER): Payer: Self-pay | Admitting: Surgery

## 2022-06-03 VITALS — BP 137/81 | HR 92 | Temp 98.9°F | Ht 68.0 in | Wt 356.0 lb

## 2022-06-03 DIAGNOSIS — K420 Umbilical hernia with obstruction, without gangrene: Secondary | ICD-10-CM

## 2022-06-03 NOTE — Progress Notes (Signed)
06/03/2022  Reason for Visit:  Umbilical hernia  History of Present Illness: Lance Jensen is a 27 y.o. male presenting for evaluation of an umbilical hernia.  The patient reports that he had has an umbilical hernia since birth, and had never really caused any problems until September 2023.  He presented to ED on 01/26/22 and more recently to Urgent Care on 05/29/22 for the same.  No imaging was required.  He reports that he's having very frequent pain in the umbilical area.  Initially his hernia did not cause any issues and it was reducible.  Now he has more discomfort and is harder to push and sometimes sticks out some.  When he bends over to reach for a bottom drawer, the umbilicus causes some discomfort.  It gets better if he's able to lie down, particularly after a day at work.  He is a Char[employer] fundraiser.  Denies any nausea, vomiting, constipation.  He has diarrhea, which is a chronic issue for him.  Denies any abdominal surgeries.  Past Medical History: Past Medical History:  Diagnosis Date   Seasonal allergies      Past Surgical History: History reviewed. No pertinent surgical history.  Home Medications: Prior to Admission medications   Medication Sig Start Date End Date Taking? Authorizing Provider  ondansetron (ZOFRAN) 4 MG tablet Take 1 tablet (4 mg total) by mouth every 8 (eight) hours as needed for nausea or vomiting. 05/29/22  Yes Nyoka Lint, PA-C  tiZANidine (ZANAFLEX) 2 MG tablet Take 1 tablet (2 mg total) by mouth every 8 (eight) hours as needed for muscle spasms. 04/29/21  Yes Hazel Sams, PA-C    Allergies: No Known Allergies  Social History:  reports that he has quit smoking. His smoking use included cigarettes. He has been exposed to tobacco smoke. He has never used smokeless tobacco. He reports that he does not currently use drugs after having used the following drugs: Marijuana. He reports that he does not drink alcohol.   Family History: Family History  Problem Relation  Age of Onset   Healthy Mother     Review of Systems: Review of Systems  Constitutional:  Negative for chills and fever.  HENT:  Negative for hearing loss.   Respiratory:  Negative for shortness of breath.   Cardiovascular:  Negative for chest pain.  Gastrointestinal:  Positive for abdominal pain and diarrhea. Negative for constipation, nausea and vomiting.  Genitourinary:  Negative for dysuria.  Musculoskeletal:  Negative for myalgias.  Skin:  Negative for rash.  Neurological:  Negative for dizziness.  Psychiatric/Behavioral:  Negative for depression.     Physical Exam BP 137/81   Pulse 92   Temp 98.9 F (37.2 C)   Ht 5\' 8"  (1.727 m)   Wt (!) 356 lb (161.5 kg)   SpO2 98%   BMI 54.13 kg/m  CONSTITUTIONAL: No acute distress HEENT:  Normocephalic, atraumatic, extraocular motion intact. NECK: Trachea is midline, and there is no jugular venous distension.  RESPIRATORY:  Lungs are clear, and breath sounds are equal bilaterally. Normal respiratory effort without pathologic use of accessory muscles. CARDIOVASCULAR: Heart is regular without murmurs, gallops, or rubs. GI: The abdomen is soft, obese, non-distended, with some tenderness to palpation at the superior portion of the umbilicus.  He does have bulging of what appears to be preperitoneal fat.  I'm unable to discern the exact hernia size due to body habitus, but appears small.  It is not reducible.  No overlying skin changes or ulceration.  MUSCULOSKELETAL:  Normal muscle strength and tone in all four extremities.  No peripheral edema or cyanosis. SKIN: Skin turgor is normal. There are no pathologic skin lesions.  NEUROLOGIC:  Motor and sensation is grossly normal.  Cranial nerves are grossly intact. PSYCH:  Alert and oriented to person, place and time. Affect is normal.  Laboratory Analysis: Labs 01/26/22: Na 140, K 4.1, Cl 101, CO2 27, BUN 9, Cr 0.65.  Total bili 0.5, AST 29, ALT 48, Alk Phos 87. WBC 9, Hgb 13, Hct 40, Plt  271.  Imaging: No results found.  Assessment and Plan: This is a 27 y.o. male with an incarcerated umbilical hernia.  --Discussed with the patient the types of hernias ranging from reducible to incarceated, to strangulated.  His is incarcerated and he is also now having more symptoms from it.  Discussed with him that typically for his BMI that is elevated, we would recommend weight loss prior to surgery given the increased risk of complications or hernia recurrence, but given that his symptoms are progressing, discussed with him that it would be better to proceed with surgical repair.  He is in agreement. --Discussed with him the role for a robotic assisted umbilical hernia repair with mesh and reviewed the surgery at length with him including the risks of bleeding, infection, injury to surrounding structures, that this would be an outpatient surgery, postop activity restrictions, the use of an abdominal binder, pain control, and he's willing to proceed. --Will schedule his surgery for 06/11/22.  All of his questions have been answered.  I spent 60 minutes dedicated to the care of this patient on the date of this encounter to include pre-visit review of records, face-to-face time with the patient discussing diagnosis and management, and any post-visit coordination of care.   Howie Ill, MD [facility]

## 2022-06-03 NOTE — H&P (View-Only) (Signed)
06/03/2022  Reason for Visit:  Umbilical hernia  History of Present Illness: Lance Jensen is a 27 y.o. male presenting for evaluation of an umbilical hernia.  The patient reports that he had has an umbilical hernia since birth, and had never really caused any problems until September 2023.  He presented to ED on 01/26/22 and more recently to Urgent Care on 05/29/22 for the same.  No imaging was required.  He reports that he's having very frequent pain in the umbilical area.  Initially his hernia did not cause any issues and it was reducible.  Now he has more discomfort and is harder to push and sometimes sticks out some.  When he bends over to reach for a bottom drawer, the umbilicus causes some discomfort.  It gets better if he's able to lie down, particularly after a day at work.  He is a Char[employer] fundraiser.  Denies any nausea, vomiting, constipation.  He has diarrhea, which is a chronic issue for him.  Denies any abdominal surgeries.  Past Medical History: Past Medical History:  Diagnosis Date   Seasonal allergies      Past Surgical History: History reviewed. No pertinent surgical history.  Home Medications: Prior to Admission medications   Medication Sig Start Date End Date Taking? Authorizing Provider  ondansetron (ZOFRAN) 4 MG tablet Take 1 tablet (4 mg total) by mouth every 8 (eight) hours as needed for nausea or vomiting. 05/29/22  Yes Nyoka Lint, PA-C  tiZANidine (ZANAFLEX) 2 MG tablet Take 1 tablet (2 mg total) by mouth every 8 (eight) hours as needed for muscle spasms. 04/29/21  Yes Hazel Sams, PA-C    Allergies: No Known Allergies  Social History:  reports that he has quit smoking. His smoking use included cigarettes. He has been exposed to tobacco smoke. He has never used smokeless tobacco. He reports that he does not currently use drugs after having used the following drugs: Marijuana. He reports that he does not drink alcohol.   Family History: Family History  Problem Relation  Age of Onset   Healthy Mother     Review of Systems: Review of Systems  Constitutional:  Negative for chills and fever.  HENT:  Negative for hearing loss.   Respiratory:  Negative for shortness of breath.   Cardiovascular:  Negative for chest pain.  Gastrointestinal:  Positive for abdominal pain and diarrhea. Negative for constipation, nausea and vomiting.  Genitourinary:  Negative for dysuria.  Musculoskeletal:  Negative for myalgias.  Skin:  Negative for rash.  Neurological:  Negative for dizziness.  Psychiatric/Behavioral:  Negative for depression.     Physical Exam BP 137/81   Pulse 92   Temp 98.9 F (37.2 C)   Ht 5\' 8"  (1.727 m)   Wt (!) 356 lb (161.5 kg)   SpO2 98%   BMI 54.13 kg/m  CONSTITUTIONAL: No acute distress HEENT:  Normocephalic, atraumatic, extraocular motion intact. NECK: Trachea is midline, and there is no jugular venous distension.  RESPIRATORY:  Lungs are clear, and breath sounds are equal bilaterally. Normal respiratory effort without pathologic use of accessory muscles. CARDIOVASCULAR: Heart is regular without murmurs, gallops, or rubs. GI: The abdomen is soft, obese, non-distended, with some tenderness to palpation at the superior portion of the umbilicus.  He does have bulging of what appears to be preperitoneal fat.  I'm unable to discern the exact hernia size due to body habitus, but appears small.  It is not reducible.  No overlying skin changes or ulceration.  MUSCULOSKELETAL:  Normal muscle strength and tone in all four extremities.  No peripheral edema or cyanosis. SKIN: Skin turgor is normal. There are no pathologic skin lesions.  NEUROLOGIC:  Motor and sensation is grossly normal.  Cranial nerves are grossly intact. PSYCH:  Alert and oriented to person, place and time. Affect is normal.  Laboratory Analysis: Labs 01/26/22: Na 140, K 4.1, Cl 101, CO2 27, BUN 9, Cr 0.65.  Total bili 0.5, AST 29, ALT 48, Alk Phos 87. WBC 9, Hgb 13, Hct 40, Plt  271.  Imaging: No results found.  Assessment and Plan: This is a 27 y.o. male with an incarcerated umbilical hernia.  --Discussed with the patient the types of hernias ranging from reducible to incarceated, to strangulated.  His is incarcerated and he is also now having more symptoms from it.  Discussed with him that typically for his BMI that is elevated, we would recommend weight loss prior to surgery given the increased risk of complications or hernia recurrence, but given that his symptoms are progressing, discussed with him that it would be better to proceed with surgical repair.  He is in agreement. --Discussed with him the role for a robotic assisted umbilical hernia repair with mesh and reviewed the surgery at length with him including the risks of bleeding, infection, injury to surrounding structures, that this would be an outpatient surgery, postop activity restrictions, the use of an abdominal binder, pain control, and he's willing to proceed. --Will schedule his surgery for 06/11/22.  All of his questions have been answered.  I spent 60 minutes dedicated to the care of this patient on the date of this encounter to include pre-visit review of records, face-to-face time with the patient discussing diagnosis and management, and any post-visit coordination of care.   Tranika Scholler Luis Aroldo Galli, MD [facility]    

## 2022-06-03 NOTE — Patient Instructions (Signed)
You have requested for your Umbilical Hernia be repaired. This will be scheduled with Dr. Piscoya at [facility].   Please see your (blue)pre-care sheet for information. Our surgery scheduler will call you to verify surgery date and to go over information.   You will need to arrange to be off work for 1-2 weeks but will have to have a lifting restriction of no more than 15 lbs for 6 weeks following your surgery. If you have FMLA or disability paperwork that needs filled out you may drop this off at our office or this can be faxed to [phone].     Umbilical Hernia, Adult A hernia is a bulge of tissue that pushes through an opening between muscles. An umbilical hernia happens in the abdomen, near the belly button (umbilicus). The hernia may contain tissues from the small intestine, large intestine, or fatty tissue covering the intestines (omentum). Umbilical hernias in adults tend to get worse over time, and they require surgical treatment. There are several types of umbilical hernias. You may have: A hernia located just above or below the umbilicus (indirect hernia). This is the most common type of umbilical hernia in adults. A hernia that forms through an opening formed by the umbilicus (direct hernia). A hernia that comes and goes (reducible hernia). A reducible hernia may be visible only when you strain, lift something heavy, or cough. This type of hernia can be pushed back into the abdomen (reduced). A hernia that traps abdominal tissue inside the hernia (incarcerated hernia). This type of hernia cannot be reduced. A hernia that cuts off blood flow to the tissues inside the hernia (strangulated hernia). The tissues can start to die if this happens. This type of hernia requires emergency treatment.  What are the causes? An umbilical hernia happens when tissue inside the abdomen presses on a weak area of the abdominal muscles. What increases the risk? You may have a  greater risk of this condition if you: Are obese. Have had several pregnancies. Have a buildup of fluid inside your abdomen (ascites). Have had surgery that weakens the abdominal muscles.  What are the signs or symptoms? The main symptom of this condition is a painless bulge at or near the belly button. A reducible hernia may be visible only when you strain, lift something heavy, or cough. Other symptoms may include: Dull pain. A feeling of pressure.  Symptoms of a strangulated hernia may include: Pain that gets increasingly worse. Nausea and vomiting. Pain when pressing on the hernia. Skin over the hernia becoming red or purple. Constipation. Blood in the stool.  How is this diagnosed? This condition may be diagnosed based on: A physical exam. You may be asked to cough or strain while standing. These actions increase the pressure inside your abdomen and force the hernia through the opening in your muscles. Your health care provider may try to reduce the hernia by pressing on it. Your symptoms and medical history.  How is this treated? Surgery is the only treatment for an umbilical hernia. Surgery for a strangulated hernia is done as soon as possible. If you have a small hernia that is not incarcerated, you may need to lose weight before having surgery. Follow these instructions at home: Lose weight, if told by your health care provider. Do not try to push the hernia back in. Watch your hernia for any changes in color or size. Tell your health care provider if any changes occur. You may need to avoid activities   that increase pressure on your hernia. Do not lift anything that is heavier than 10 lb (4.5 kg) until your health care provider says that this is safe. Take over-the-counter and prescription medicines only as told by your health care provider. Keep all follow-up visits as told by your health care provider. This is important. Contact a health care provider if: Your hernia  gets larger. Your hernia becomes painful. Get help right away if: You develop sudden, severe pain near the area of your hernia. You have pain as well as nausea or vomiting. You have pain and the skin over your hernia changes color. You develop a fever. This information is not intended to replace advice given to you by your health care provider. Make sure you discuss any questions you have with your health care provider. Document Released: 10/13/2015 Document Revised: 01/14/2016 Document Reviewed: 10/13/2015 Elsevier Interactive Patient Education  2018 [employer]    

## 2022-06-04 ENCOUNTER — Telehealth: Payer: Self-pay | Admitting: Surgery

## 2022-06-04 NOTE — Telephone Encounter (Signed)
Not able to leave message, voice mailbox not set up.  If patient calls back, please inform him of the following regarding scheduled surgery with Dr. Hampton Abbot.     Pre-Admission date/time, and Surgery date at [facility].  Surgery Date: 06/11/22 Preadmission Testing Date: 06/07/22 (phone 1p-5p)  Also patient will need to call at [phone], between 1-3:00pm the day before surgery, to find out what time to arrive for surgery.

## 2022-06-06 NOTE — Telephone Encounter (Signed)
Called patient again, he is now aware of all dates regarding his surgery scheduled for 06/11/22 with Dr. Hampton Abbot @ [facility].

## 2022-06-07 ENCOUNTER — Encounter
Admission: RE | Admit: 2022-06-07 | Discharge: 2022-06-07 | Disposition: A | Discharge status: Home / Self Care | Payer: Self-pay | Source: Ambulatory Visit | Attending: Surgery | Admitting: Surgery

## 2022-06-07 ENCOUNTER — Other Ambulatory Visit: Payer: Self-pay

## 2022-06-07 HISTORY — DX: Gastro-esophageal reflux disease without esophagitis: K21.9

## 2022-06-07 NOTE — Patient Instructions (Signed)
Your procedure is scheduled on: 06/11/22 Report to [facility]. To find out your arrival time please call [phone] between 1PM - 3PM on 06/10/22.  Remember: Instructions that are not followed completely may result in serious medical risk, up to and including death, or upon the discretion of your surgeon and anesthesiologist your surgery may need to be rescheduled.     _X__ 1. Do not eat food after midnight the night before your procedure.                 No gum chewing or hard candies. You may drink clear liquids up to 2 hours                 before you are scheduled to arrive for your surgery- DO not drink clear                 liquids within 2 hours of the start of your surgery.                 Clear Liquids include:  water, apple juice without pulp, clear carbohydrate                 drink such as Clearfast or Gatorade, Black Coffee or Tea (Do not add                 anything to coffee or tea). Diabetics water only  __X__2.  On the morning of surgery brush your teeth with toothpaste and water, you                 may rinse your mouth with mouthwash if you wish.  Do not swallow any              toothpaste of mouthwash.     _X__ 3.  No Alcohol for 24 hours before or after surgery.   _X__ 4.  Do Not Smoke or use e-cigarettes For 24 Hours Prior to Your Surgery.                 Do not use any chewable tobacco products for at least 6 hours prior to                 surgery.  ____  5.  Bring all medications with you on the day of surgery if instructed.   __X__  6.  Notify your doctor if there is any change in your medical condition      (cold, fever, infections).     Do not wear jewelry, make-up, hairpins, clips or nail polish. Do not wear lotions, powders, or perfumes. You can use deodorant Do not shave body hair 48 hours prior to surgery. Men may shave face and neck. Do not bring valuables to the hospital.    [facility] is not  responsible for any belongings or valuables.  Contacts, dentures/partials or body piercings may not be worn into surgery. Bring a case for your contacts, glasses or hearing aids, a denture cup will be supplied. Leave your suitcase in the car. After surgery it may be brought to your room. For patients admitted to the hospital, discharge time is determined by your treatment team.   Patients discharged the day of surgery will need an adult driver. You will not be allowed to drive yourself home, take an uber, lyft or taxi. You may use medical transportation such as [place] as long as you have  an adult with you or waiting to assist you upon your arrival home. You must have an adult over 52 years old in the home with you for the first 24 hours after surgery    __X__ Take these medicines the morning of surgery with A SIP OF WATER:    1. none  2.   3.   ____ Fleet Enema (as directed)   ____ Use CHG Soap/SAGE wipes as directed  ____ Use inhalers on the day of surgery  ____ Stop metformin/Janumet/Farxiga 2 days prior to surgery    ____ Take 1/2 of usual insulin dose the night before surgery. No insulin the morning          of surgery.   ____ Stop Blood Thinners Coumadin/Plavix/Xarelto/Pleta/Pradaxa/Eliquis/Effient/Aspirin  on   Or contact your Surgeon, Cardiologist or Medical Doctor regarding  ability to stop your blood thinners  __X__ Stop Anti-inflammatories 7 days before surgery such as Advil, Ibuprofen, Motrin,  BC or Goodies Powder, Naprosyn, Naproxen, Aleve, Aspirin    __X__ Stop all herbals and supplements, fish oil or vitamins  until after surgery.    ____ Bring C-Pap to the hospital.

## 2022-06-10 MED ORDER — FAMOTIDINE 20 MG PO TABS: 20.0000 mg | ORAL_TABLET | Freq: Once | ORAL | Status: AC

## 2022-06-10 MED ORDER — BUPIVACAINE LIPOSOME 1.3 % IJ SUSP: 20.0000 mL | Freq: Once | INTRAMUSCULAR | Status: DC

## 2022-06-10 MED ORDER — LACTATED RINGERS IV SOLN: INTRAVENOUS | Status: DC

## 2022-06-10 MED ORDER — GABAPENTIN 300 MG PO CAPS: 300.0000 mg | ORAL_CAPSULE | ORAL | Status: AC

## 2022-06-10 MED ORDER — CHLORHEXIDINE GLUCONATE CLOTH 2 % EX PADS
6.0000 | MEDICATED_PAD | Freq: Once | CUTANEOUS | Status: AC
  Administered 2022-06-11: 6 via TOPICAL

## 2022-06-10 MED ORDER — CHLORHEXIDINE GLUCONATE CLOTH 2 % EX PADS: 6.0000 | MEDICATED_PAD | Freq: Once | CUTANEOUS | Status: DC

## 2022-06-10 MED ORDER — CEFAZOLIN IN SODIUM CHLORIDE 3-0.9 GM/100ML-% IV SOLN
3.0000 g | INTRAVENOUS | Status: AC
  Administered 2022-06-11: 3 g via INTRAVENOUS
  Filled 2022-06-10: qty 100

## 2022-06-10 MED ORDER — ORAL CARE MOUTH RINSE: 15.0000 mL | Freq: Once | OROMUCOSAL | Status: AC

## 2022-06-10 MED ORDER — ACETAMINOPHEN 500 MG PO TABS: 1000.0000 mg | ORAL_TABLET | ORAL | Status: AC

## 2022-06-10 MED ORDER — CHLORHEXIDINE GLUCONATE 0.12 % MT SOLN: 15.0000 mL | Freq: Once | OROMUCOSAL | Status: AC

## 2022-06-11 ENCOUNTER — Encounter
Admission: RE | Disposition: A | Discharge status: Home / Self Care | Payer: Self-pay | Source: Ambulatory Visit | Attending: Surgery

## 2022-06-11 ENCOUNTER — Ambulatory Visit
Admission: RE | Admit: 2022-06-11 | Discharge: 2022-06-11 | Disposition: A | Discharge status: Home / Self Care | Payer: Self-pay | Source: Ambulatory Visit | Attending: Surgery | Admitting: Surgery

## 2022-06-11 ENCOUNTER — Ambulatory Visit: Payer: Self-pay | Admitting: Certified Registered"

## 2022-06-11 ENCOUNTER — Encounter: Payer: Self-pay | Admitting: Surgery

## 2022-06-11 ENCOUNTER — Other Ambulatory Visit: Payer: Self-pay

## 2022-06-11 DIAGNOSIS — Z6841 Body Mass Index (BMI) 40.0 and over, adult: Secondary | ICD-10-CM | POA: Insufficient documentation

## 2022-06-11 DIAGNOSIS — K420 Umbilical hernia with obstruction, without gangrene: Secondary | ICD-10-CM

## 2022-06-11 DIAGNOSIS — K439 Ventral hernia without obstruction or gangrene: Secondary | ICD-10-CM | POA: Insufficient documentation

## 2022-06-11 DIAGNOSIS — K436 Other and unspecified ventral hernia with obstruction, without gangrene: Secondary | ICD-10-CM

## 2022-06-11 DIAGNOSIS — Z87891 Personal history of nicotine dependence: Secondary | ICD-10-CM | POA: Insufficient documentation

## 2022-06-11 DIAGNOSIS — Z7722 Contact with and (suspected) exposure to environmental tobacco smoke (acute) (chronic): Secondary | ICD-10-CM | POA: Insufficient documentation

## 2022-06-11 HISTORY — PX: INSERTION OF MESH: SHX5868

## 2022-06-11 HISTORY — PX: ROBOTIC ASSISTED LAPAROSCOPIC VENTRAL/INCISIONAL HERNIA REPAIR: SHX6607

## 2022-06-11 SURGERY — REPAIR, HERNIA, UMBILICAL, ROBOT-ASSISTED
Anesthesia: General | Site: Abdomen

## 2022-06-11 MED ORDER — PROPOFOL 10 MG/ML IV BOLUS
INTRAVENOUS | Status: DC | PRN
  Administered 2022-06-11: 250 mg via INTRAVENOUS

## 2022-06-11 MED ORDER — BUPIVACAINE-EPINEPHRINE (PF) 0.5% -1:200000 IJ SOLN
INTRAMUSCULAR | Status: AC
  Filled 2022-06-11: qty 30

## 2022-06-11 MED ORDER — OXYCODONE HCL 5 MG PO TABS
5.0000 mg | ORAL_TABLET | Freq: Once | ORAL | Status: AC | PRN
  Administered 2022-06-11: 5 mg via ORAL

## 2022-06-11 MED ORDER — HYDROMORPHONE HCL 1 MG/ML IJ SOLN
INTRAMUSCULAR | Status: DC | PRN
  Administered 2022-06-11 (×2): .5 mg via INTRAVENOUS

## 2022-06-11 MED ORDER — DEXAMETHASONE SODIUM PHOSPHATE 10 MG/ML IJ SOLN
INTRAMUSCULAR | Status: DC | PRN
  Administered 2022-06-11: 10 mg via INTRAVENOUS

## 2022-06-11 MED ORDER — OXYCODONE HCL 5 MG PO TABS
ORAL_TABLET | ORAL | Status: AC
  Filled 2022-06-11: qty 1

## 2022-06-11 MED ORDER — SUGAMMADEX SODIUM 500 MG/5ML IV SOLN
INTRAVENOUS | Status: AC
  Filled 2022-06-11: qty 5

## 2022-06-11 MED ORDER — BUPIVACAINE LIPOSOME 1.3 % IJ SUSP
INTRAMUSCULAR | Status: AC
  Filled 2022-06-11: qty 20

## 2022-06-11 MED ORDER — FENTANYL CITRATE (PF) 100 MCG/2ML IJ SOLN
INTRAMUSCULAR | Status: DC | PRN
  Administered 2022-06-11: 100 ug via INTRAVENOUS

## 2022-06-11 MED ORDER — LACTATED RINGERS IV SOLN: INTRAVENOUS | Status: DC

## 2022-06-11 MED ORDER — IBUPROFEN 800 MG PO TABS: 800.0000 mg | ORAL_TABLET | Freq: Three times a day (TID) | ORAL | 1 refills | Status: DC | PRN

## 2022-06-11 MED ORDER — MIDAZOLAM HCL 2 MG/2ML IJ SOLN
INTRAMUSCULAR | Status: AC
  Filled 2022-06-11: qty 2

## 2022-06-11 MED ORDER — SODIUM CHLORIDE (PF) 0.9 % IJ SOLN
INTRAMUSCULAR | Status: DC | PRN
  Administered 2022-06-11: 80 mL via INTRAMUSCULAR

## 2022-06-11 MED ORDER — FAMOTIDINE 20 MG PO TABS
ORAL_TABLET | ORAL | Status: AC
  Administered 2022-06-11: 20 mg via ORAL
  Filled 2022-06-11: qty 1

## 2022-06-11 MED ORDER — KETOROLAC TROMETHAMINE 30 MG/ML IJ SOLN
INTRAMUSCULAR | Status: DC | PRN
  Administered 2022-06-11: 30 mg via INTRAVENOUS

## 2022-06-11 MED ORDER — SUCCINYLCHOLINE CHLORIDE 200 MG/10ML IV SOSY
PREFILLED_SYRINGE | INTRAVENOUS | Status: DC | PRN
  Administered 2022-06-11: 140 mg via INTRAVENOUS

## 2022-06-11 MED ORDER — ACETAMINOPHEN 500 MG PO TABS
ORAL_TABLET | ORAL | Status: AC
  Administered 2022-06-11: 1000 mg via ORAL
  Filled 2022-06-11: qty 2

## 2022-06-11 MED ORDER — ONDANSETRON HCL 4 MG/2ML IJ SOLN: 4.0000 mg | Freq: Once | INTRAMUSCULAR | Status: DC | PRN

## 2022-06-11 MED ORDER — ACETAMINOPHEN 10 MG/ML IV SOLN: 1000.0000 mg | Freq: Once | INTRAVENOUS | Status: DC | PRN

## 2022-06-11 MED ORDER — ROCURONIUM BROMIDE 100 MG/10ML IV SOLN
INTRAVENOUS | Status: DC | PRN
  Administered 2022-06-11: 30 mg via INTRAVENOUS
  Administered 2022-06-11: 20 mg via INTRAVENOUS
  Administered 2022-06-11: 30 mg via INTRAVENOUS
  Administered 2022-06-11: 20 mg via INTRAVENOUS

## 2022-06-11 MED ORDER — ONDANSETRON HCL 4 MG/2ML IJ SOLN
INTRAMUSCULAR | Status: DC | PRN
  Administered 2022-06-11: 4 mg via INTRAVENOUS

## 2022-06-11 MED ORDER — DEXAMETHASONE SODIUM PHOSPHATE 10 MG/ML IJ SOLN
INTRAMUSCULAR | Status: AC
  Filled 2022-06-11: qty 2

## 2022-06-11 MED ORDER — ACETAMINOPHEN 10 MG/ML IV SOLN
INTRAVENOUS | Status: AC
  Filled 2022-06-11: qty 100

## 2022-06-11 MED ORDER — ACETAMINOPHEN 500 MG PO TABS: 1000.0000 mg | ORAL_TABLET | Freq: Four times a day (QID) | ORAL | Status: DC | PRN

## 2022-06-11 MED ORDER — OXYCODONE HCL 5 MG PO TABS: 5.0000 mg | ORAL_TABLET | ORAL | 0 refills | Status: DC | PRN

## 2022-06-11 MED ORDER — SUGAMMADEX SODIUM 500 MG/5ML IV SOLN
INTRAVENOUS | Status: DC | PRN
  Administered 2022-06-11: 500 mg via INTRAVENOUS

## 2022-06-11 MED ORDER — DEXMEDETOMIDINE HCL IN NACL 200 MCG/50ML IV SOLN
INTRAVENOUS | Status: DC | PRN
  Administered 2022-06-11 (×3): 4 ug via INTRAVENOUS
  Administered 2022-06-11: 8 ug via INTRAVENOUS
  Administered 2022-06-11: 4 ug via INTRAVENOUS

## 2022-06-11 MED ORDER — LACTATED RINGERS IV SOLN: INTRAVENOUS | Status: DC | PRN

## 2022-06-11 MED ORDER — DEXMEDETOMIDINE HCL IN NACL 80 MCG/20ML IV SOLN
INTRAVENOUS | Status: AC
  Filled 2022-06-11: qty 20

## 2022-06-11 MED ORDER — LIDOCAINE HCL (CARDIAC) PF 100 MG/5ML IV SOSY
PREFILLED_SYRINGE | INTRAVENOUS | Status: DC | PRN
  Administered 2022-06-11: 100 mg via INTRAVENOUS

## 2022-06-11 MED ORDER — OXYCODONE HCL 5 MG/5ML PO SOLN: 5.0000 mg | Freq: Once | ORAL | Status: AC | PRN

## 2022-06-11 MED ORDER — GABAPENTIN 300 MG PO CAPS
ORAL_CAPSULE | ORAL | Status: AC
  Administered 2022-06-11: 300 mg via ORAL
  Filled 2022-06-11: qty 1

## 2022-06-11 MED ORDER — ALBUTEROL SULFATE HFA 108 (90 BASE) MCG/ACT IN AERS
INHALATION_SPRAY | RESPIRATORY_TRACT | Status: DC | PRN
  Administered 2022-06-11 (×3): 4 via RESPIRATORY_TRACT

## 2022-06-11 MED ORDER — SODIUM CHLORIDE FLUSH 0.9 % IV SOLN
INTRAVENOUS | Status: AC
  Filled 2022-06-11: qty 30

## 2022-06-11 MED ORDER — ONDANSETRON HCL 4 MG/2ML IJ SOLN
INTRAMUSCULAR | Status: AC
  Filled 2022-06-11: qty 6

## 2022-06-11 MED ORDER — PROPOFOL 10 MG/ML IV BOLUS
INTRAVENOUS | Status: AC
  Filled 2022-06-11: qty 60

## 2022-06-11 MED ORDER — ALBUTEROL SULFATE HFA 108 (90 BASE) MCG/ACT IN AERS
INHALATION_SPRAY | RESPIRATORY_TRACT | Status: AC
  Filled 2022-06-11: qty 6.7

## 2022-06-11 MED ORDER — FENTANYL CITRATE (PF) 100 MCG/2ML IJ SOLN: 25.0000 ug | INTRAMUSCULAR | Status: DC | PRN

## 2022-06-11 MED ORDER — 0.9 % SODIUM CHLORIDE (POUR BTL) OPTIME
TOPICAL | Status: DC | PRN
  Administered 2022-06-11: 500 mL

## 2022-06-11 MED ORDER — FENTANYL CITRATE (PF) 100 MCG/2ML IJ SOLN
INTRAMUSCULAR | Status: AC
  Filled 2022-06-11: qty 2

## 2022-06-11 MED ORDER — CHLORHEXIDINE GLUCONATE 0.12 % MT SOLN
OROMUCOSAL | Status: AC
  Administered 2022-06-11: 15 mL via OROMUCOSAL
  Filled 2022-06-11: qty 15

## 2022-06-11 MED ORDER — HYDROMORPHONE HCL 1 MG/ML IJ SOLN
INTRAMUSCULAR | Status: AC
  Filled 2022-06-11: qty 1

## 2022-06-11 SURGICAL SUPPLY — 72 items
ADH SKN CLS APL DERMABOND .7 (GAUZE/BANDAGES/DRESSINGS) ×2
BINDER ABDOMINAL 12 ML 46-62 (SOFTGOODS) IMPLANT
BLADE SURG SZ11 CARB STEEL (BLADE) ×2 IMPLANT
CANNULA REDUC XI 12-8 STAPL (CANNULA) ×2
CANNULA REDUCER 12-8 DVNC XI (CANNULA) ×2 IMPLANT
COVER LIGHT HANDLE STERIS (MISCELLANEOUS) IMPLANT
COVER TIP SHEARS 8 DVNC (MISCELLANEOUS) ×2 IMPLANT
COVER TIP SHEARS 8MM DA VINCI (MISCELLANEOUS) ×2
COVER WAND RF STERILE (DRAPES) ×2 IMPLANT
DERMABOND ADVANCED .7 DNX12 (GAUZE/BANDAGES/DRESSINGS) ×2 IMPLANT
DRAPE ARM DVNC X/XI (DISPOSABLE) ×6 IMPLANT
DRAPE COLUMN DVNC XI (DISPOSABLE) ×2 IMPLANT
DRAPE DA VINCI XI ARM (DISPOSABLE) ×6
DRAPE DA VINCI XI COLUMN (DISPOSABLE) ×2
ELECT CAUTERY BLADE TIP 2.5 (TIP) ×2
ELECT REM PT RETURN 9FT ADLT (ELECTROSURGICAL) ×2
ELECTRODE CAUTERY BLDE TIP 2.5 (TIP) ×2 IMPLANT
ELECTRODE REM PT RTRN 9FT ADLT (ELECTROSURGICAL) ×2 IMPLANT
GLOVE SURG SYN 7.0 (GLOVE) ×4 IMPLANT
GLOVE SURG SYN 7.0 PF PI (GLOVE) ×4 IMPLANT
GLOVE SURG SYN 7.5  E (GLOVE) ×4
GLOVE SURG SYN 7.5 E (GLOVE) ×4 IMPLANT
GLOVE SURG SYN 7.5 PF PI (GLOVE) ×4 IMPLANT
GOWN STRL REUS W/ TWL LRG LVL3 (GOWN DISPOSABLE) ×6 IMPLANT
GOWN STRL REUS W/TWL LRG LVL3 (GOWN DISPOSABLE) ×6
GRASPER SUT TROCAR 14GX15 (MISCELLANEOUS) ×2 IMPLANT
IRRIGATION STRYKERFLOW (MISCELLANEOUS) IMPLANT
IRRIGATOR STRYKERFLOW (MISCELLANEOUS)
IV NS 1000ML (IV SOLUTION)
IV NS 1000ML BAXH (IV SOLUTION) IMPLANT
KIT PINK PAD W/HEAD ARE REST (MISCELLANEOUS) ×2
KIT PINK PAD W/HEAD ARM REST (MISCELLANEOUS) ×2 IMPLANT
LABEL OR SOLS (LABEL) ×2 IMPLANT
MANIFOLD NEPTUNE II (INSTRUMENTS) ×2 IMPLANT
MESH VENT LT ST 15CM CRL ECHO2 (Mesh General) IMPLANT
MESH VENTRALIGHT ST 4.5 ECHO (Mesh General) IMPLANT
MESH VENTRALIGHT ST 6X8 (Mesh General) ×2 IMPLANT
MESH VENTRLGHT ELIP 8X6XMFL (Mesh General) IMPLANT
NDL INSUFFLATION 14GA 120MM (NEEDLE) ×2 IMPLANT
NEEDLE HYPO 22GX1.5 SAFETY (NEEDLE) ×2 IMPLANT
NEEDLE INSUFFLATION 14GA 120MM (NEEDLE) ×2 IMPLANT
OBTURATOR OPTICAL STANDARD 8MM (TROCAR) ×2
OBTURATOR OPTICAL STND 8 DVNC (TROCAR) ×2
OBTURATOR OPTICALSTD 8 DVNC (TROCAR) ×2 IMPLANT
PACK LAP CHOLECYSTECTOMY (MISCELLANEOUS) ×2 IMPLANT
SEAL CANN UNIV 5-8 DVNC XI (MISCELLANEOUS) ×4 IMPLANT
SEAL XI 5MM-8MM UNIVERSAL (MISCELLANEOUS) ×6
SET BI-LUMEN FLTR TB AIRSEAL (TUBING) IMPLANT
SET TUBE SMOKE EVAC HIGH FLOW (TUBING) ×2 IMPLANT
SOLUTION ELECTROLUBE (MISCELLANEOUS) ×2 IMPLANT
SPONGE T-LAP 18X18 ~~LOC~~+RFID (SPONGE) ×2 IMPLANT
STAPLER CANNULA SEAL DVNC XI (STAPLE) ×2 IMPLANT
STAPLER CANNULA SEAL XI (STAPLE) ×2
SUT MNCRL 4-0 (SUTURE) ×4
SUT MNCRL 4-0 27XMFL (SUTURE) ×4
SUT STRATAFIX 0 PDS+ CT-2 23 (SUTURE) ×2
SUT STRATAFIX PDS 30 CT-1 (SUTURE) ×2 IMPLANT
SUT VIC AB 3-0 SH 27 (SUTURE) ×2
SUT VIC AB 3-0 SH 27X BRD (SUTURE) IMPLANT
SUT VICRYL 0 UR6 27IN ABS (SUTURE) ×4 IMPLANT
SUT VLOC 90 0 VL 30X27 GL22 (SUTURE) IMPLANT
SUT VLOC 90 2/L VL 12 GS22 (SUTURE) ×4 IMPLANT
SUT VLOC 90 S/L VL9 GS22 (SUTURE) IMPLANT
SUTURE MNCRL 4-0 27XMF (SUTURE) ×2 IMPLANT
SUTURE STRATFX 0 PDS+ CT-2 23 (SUTURE) IMPLANT
SYS BAG RETRIEVAL 10MM (BASKET) ×2
SYSTEM BAG RETRIEVAL 10MM (BASKET) IMPLANT
TAPE TRANSPORE STRL 2 31045 (GAUZE/BANDAGES/DRESSINGS) ×2 IMPLANT
TRAP FLUID SMOKE EVACUATOR (MISCELLANEOUS) ×2 IMPLANT
TRAY FOLEY SLVR 16FR LF STAT (SET/KITS/TRAYS/PACK) ×2 IMPLANT
TROCAR PORT AIRSEAL 8X100 (TROCAR) IMPLANT
WATER STERILE IRR 500ML POUR (IV SOLUTION) ×2 IMPLANT

## 2022-06-11 NOTE — Anesthesia Procedure Notes (Signed)
Procedure Name: Intubation Date/Time: 06/11/2022 8:03 AM  Performed by: Patience Musca., CRNAPre-anesthesia Checklist: Patient identified, Patient being monitored, Timeout performed, Emergency Drugs available and Suction available Patient Re-evaluated:Patient Re-evaluated prior to induction Oxygen Delivery Method: Circle system utilized Preoxygenation: Pre-oxygenation with 100% oxygen Induction Type: IV induction Ventilation: Mask ventilation without difficulty Laryngoscope Size: McGraph and 4 Grade View: Grade I Tube type: Oral Tube size: 7.5 mm Number of attempts: 1 Airway Equipment and Method: Stylet Placement Confirmation: ETT inserted through vocal cords under direct vision, positive ETCO2 and breath sounds checked- equal and bilateral Secured at: 23 cm Tube secured with: Tape Dental Injury: Teeth and Oropharynx as per pre-operative assessment

## 2022-06-11 NOTE — Interval H&P Note (Signed)
History and Physical Interval Note:  06/11/2022 7:10 AM  Lance Jensen  has presented today for surgery, with the diagnosis of incarcerated umbilical hernia less 3 cm.  The various methods of treatment have been discussed with the patient and family. After consideration of risks, benefits and other options for treatment, the patient has consented to  Procedure(s): XI [place] (N/A) as a surgical intervention.  The patient's history has been reviewed, patient examined, no change in status, stable for surgery.  I have reviewed the patient's chart and labs.  Questions were answered to the patient's satisfaction.     [name]

## 2022-06-11 NOTE — Anesthesia Preprocedure Evaluation (Addendum)
Anesthesia Evaluation  Patient identified by MRN, date of birth, ID band Patient awake    Reviewed: Allergy & Precautions, NPO status , Patient's Chart, lab work & pertinent test results  History of Anesthesia Complications Negative for: history of anesthetic complications  Airway Mallampati: IV   Neck ROM: Full    Dental no notable dental hx.    Pulmonary former smoker (quit 08/2020)   Pulmonary exam normal breath sounds clear to auscultation       Cardiovascular Exercise Tolerance: Good negative cardio ROS Normal cardiovascular exam Rhythm:Regular Rate:Normal     Neuro/Psych negative neurological ROS     GI/Hepatic ,GERD  ,,  Endo/Other  Class 3 obesity  Renal/GU negative Renal ROS     Musculoskeletal   Abdominal   Peds  Hematology negative hematology ROS (+)   Anesthesia Other Findings   Reproductive/Obstetrics                             Anesthesia Physical Anesthesia Plan  ASA: 3  Anesthesia Plan: General   Post-op Pain Management:    Induction: Intravenous and Rapid sequence  PONV Risk Score and Plan: 2 and Ondansetron, Dexamethasone and Treatment may vary due to age or medical condition  Airway Management Planned: Oral ETT  Additional Equipment:   Intra-op Plan:   Post-operative Plan: Extubation in OR  Informed Consent: I have reviewed the patients History and Physical, chart, labs and discussed the procedure including the risks, benefits and alternatives for the proposed anesthesia with the patient or authorized representative who has indicated his/her understanding and acceptance.     Dental advisory given  Plan Discussed with: CRNA  Anesthesia Plan Comments: (Patient consented for risks of anesthesia including but not limited to:  - adverse reactions to medications - damage to eyes, teeth, lips or other oral mucosa - nerve damage due to positioning  -  sore throat or hoarseness - damage to heart, brain, nerves, lungs, other parts of body or loss of life  Informed patient about role of CRNA in peri- and intra-operative care.  Patient voiced understanding.)       Anesthesia Quick Evaluation

## 2022-06-11 NOTE — Anesthesia Postprocedure Evaluation (Signed)
Anesthesia Post Note  Patient: Cashawn Yanko  Procedure(s) Performed: XI ROBOT ASSISTED UMBILICAL HERNIA AND EPIGASTRIC HERNIA REPAIR (Abdomen) INSERTION OF MESH (Abdomen)  Patient location during evaluation: PACU Anesthesia Type: General Level of consciousness: awake and alert, oriented and patient cooperative Pain management: pain level controlled Vital Signs Assessment: post-procedure vital signs reviewed and stable Respiratory status: spontaneous breathing, nonlabored ventilation and respiratory function stable Cardiovascular status: blood pressure returned to baseline and stable Postop Assessment: adequate PO intake Anesthetic complications: no   There were no known notable events for this encounter.   Last Vitals:  Vitals:   06/11/22 1145 06/11/22 1200  BP: (!) 144/85 139/85  Pulse: (!) 106 (!) 101  Resp: (!) 23 18  Temp:    SpO2: 100% 95%    Last Pain:  Vitals:   06/11/22 1200  TempSrc:   PainSc: 2                  Darrin Nipper

## 2022-06-11 NOTE — Op Note (Signed)
Procedure Date:  06/11/2022  Pre-operative Diagnosis:  Incarcerated umbilical hernia  Post-operative Diagnosis: Incarcerated umbilical, supraumbilical, and epigastric hernias, for total defect length of 14 cm  Procedure:  Robotic assisted incarcerated ventral Hernias Repair with mesh  Surgeon:  Melvyn Neth, MD  Anesthesia:  General endotracheal  Estimated Blood Loss:  15 ml  Specimens:  None  Complications:  None  Findings:  The patient had an incarcerated umbilical hernia measuring 1.5 cm in size, then an incarcerated supraumbilical hernia measuring 2.5 cm, and then an incarcerated epigastric hernia measuring 1 cm.  These were separated by less than 10 cm each.  Total length from the umbilical to the supraumbilical defects was 14 cm  Indications for Procedure:  This is a 27 y.o. male who presents with an incarcerated umbilical hernia.  The options of surgery versus observation were reviewed with the patient and/or family. The risks of bleeding, abscess or infection, recurrence of symptoms, potential for an open procedure, injury to surrounding structures, and chronic pain were all discussed with the patient and was willing to proceed.  Description of Procedure: The patient was correctly identified in the preoperative area and brought into the operating room.  The patient was placed supine with VTE prophylaxis in place.  Appropriate time-outs were performed.  Anesthesia was induced and the patient was intubated.  Appropriate antibiotics were infused.  The abdomen was prepped and draped in a sterile fashion. The patient's hernia defect was marked with a marking pen.  A Veress needle was introduced in the left upper quadrant and pneumoperitoneum was obtained with appropriate pressures.  Using Optiview technique, an 8 mm port was introduced in the left lateral abdominal wall without complications.  Then, a 12 mm port was introduced in the left upper quadrant and an 8 mm port in the left  lower quadrant under direct visualization.  The DaVinci platform was docked, camera targeted, and instruments placed under direct visualization.  The patient was found to have 3 total hernia defects.  He had an umbilical, supraumbilical, and epigastric hernia.  The hernias were fully reduced and the peritoneum and preperitoneal fat were dissected and resected to allow better exposure of the hernia defects and for better mesh placement.  The patient's umbilical hernia measured 1.5 cm in size, the supraumbilical hernia measured 2.5 cm, and the epigastric hernia measured 1 cm.  These were separated by less than 10 cm between individual defects.  Total length from the umbilical to the supraumbilical defects was 14 cm.  A 6 x 8 inch Bard Ventralight Echo mesh, two 0 stratafix sutures, and 4 v-lock sutures were brought into the field via the 12 mm port.  The hernia defects were closed using the stratafix suture.  A PMI was brought through the center of the hernia defect and the positioning system of the mesh was passed through and insufflated.  This allowed the mesh to splay open and be centered over the repair site with good overlap.  The mesh was then sutured in place circumferentially and through the center of the mesh using the V-loc sutures.  All needles and the positioning system were then removed through the 12 mm port without complications.  The preperitoneal fat was placed inside an Endocatch bag.  The DaVinci platform was then undocked and instruments removed.    50 ml of Exparel solution mixed with 0.5% bupivacaine with epi was infiltrated around the mesh edges, hernia repair site, and port sites.  The endocatch bag was removed.  The  12 mm port was removed and the fascia was closed under direct visualization utilizing an Endo Close technique with 0 Vicryl suture.  The 8 mm ports were removed. The 12 mm incision was closed using 3-0 Vicryl and 4-0 Monocryl, and the other port incisions were closed with 4-0  Monocryl.  The wounds were cleaned and sealed with DermaBond.  The patient was emerged from anesthesia and extubated and brought to the recovery room for further management.  The patient tolerated the procedure well and all counts were correct at the end of the case.   Melvyn Neth, MD

## 2022-06-11 NOTE — Discharge Instructions (Signed)
AMBULATORY SURGERY  DISCHARGE INSTRUCTIONS   The drugs that you were given will stay in your system until tomorrow so for the next 24 hours you should not:  Drive an automobile Make any legal decisions Drink any alcoholic beverage   You may resume regular meals tomorrow.  Today it is better to start with liquids and gradually work up to solid foods.  You may eat anything you prefer, but it is better to start with liquids, then soup and crackers, and gradually work up to solid foods.   Please notify your doctor immediately if you have any unusual bleeding, trouble breathing, redness and pain at the surgery site, drainage, fever, or pain not relieved by medication.    Additional Instructions:  PLEASE DO NOT REMOVE GREEN ARMBAND FOR 4 DAYS    Please contact your physician with any problems or Same Day Surgery at [phone], Monday through Friday 6 am to 4 pm, or [facility] [place] Main number at [phone]. 

## 2022-06-11 NOTE — Transfer of Care (Signed)
Immediate Anesthesia Transfer of Care Note  Patient: Lance Jensen  Procedure(s) Performed: XI ROBOT ASSISTED UMBILICAL HERNIA REPAIR (Abdomen) INSERTION OF MESH (Abdomen)  Patient Location: PACU  Anesthesia Type:General  Level of Consciousness: drowsy  Airway & Oxygen Therapy: Patient connected to face mask oxygen  Post-op Assessment: Report given to RN and Post -op Vital signs reviewed and stable  Post vital signs: stable  Last Vitals:  Vitals Value Taken Time  BP 128/53 06/11/22 1122  Temp    Pulse 98 06/11/22 1126  Resp 17 06/11/22 1126  SpO2 100 % 06/11/22 1126  Vitals shown include unvalidated device data.  Last Pain:  Vitals:   06/11/22 0627  TempSrc: Temporal  PainSc: 0-No pain         Complications: No notable events documented.

## 2022-06-19 ENCOUNTER — Telehealth: Payer: Self-pay | Admitting: *Deleted

## 2022-06-19 ENCOUNTER — Telehealth: Payer: Self-pay

## 2022-06-19 NOTE — Telephone Encounter (Signed)
Return to work note faxed per patient's request.

## 2022-06-19 NOTE — Telephone Encounter (Signed)
Patient called and is needing a more detailed return to work note. He returned to work today but on his note he needs to it state that he needs limited bending, moving and walking, and his lifting restrictions. He stated that since he return today and have been moving around more he if feeling a little more pain and irritation.   Patient had surgery on 09/13/35 Dr Hampton Abbot umbilical hernia and epigastric hernia repair   Patient would like note faxed to his employer with attention to [facility] or [employer] [facility] at [phone]

## 2022-06-19 NOTE — Telephone Encounter (Signed)
Lance Jensen, this patient called stating that he needs the work note you did to be edited to state the following as well.  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  Please let him know when this has been edited.  Thank you.

## 2022-06-20 ENCOUNTER — Encounter: Payer: Self-pay | Admitting: *Deleted

## 2022-06-26 ENCOUNTER — Encounter: Payer: Self-pay | Admitting: Surgery

## 2022-06-26 ENCOUNTER — Ambulatory Visit (INDEPENDENT_AMBULATORY_CARE_PROVIDER_SITE_OTHER): Payer: Self-pay | Admitting: Surgery

## 2022-06-26 VITALS — BP 146/82 | HR 94 | Temp 98.7°F | Ht 68.0 in | Wt 351.6 lb

## 2022-06-26 DIAGNOSIS — K420 Umbilical hernia with obstruction, without gangrene: Secondary | ICD-10-CM

## 2022-06-26 DIAGNOSIS — K436 Other and unspecified ventral hernia with obstruction, without gangrene: Secondary | ICD-10-CM

## 2022-06-26 DIAGNOSIS — Z09 Encounter for follow-up examination after completed treatment for conditions other than malignant neoplasm: Secondary | ICD-10-CM

## 2022-06-26 NOTE — Progress Notes (Signed)
  06/26/2022  History of Present Illness: Slate Debroux is a 27 y.o. male robotic assisted repair of an incarcerated umbilical, supraumbilical, and epigastric hernia on 06/11/22.  Overall length of repair was 14 cm.  Patient presents for follow up.  Reports that he's been doing well, but still remains sore, particularly after moving more in a given day at work.  He reports that bending down is sore.  Denies any worsening pain, nausea, vomiting.  Past Medical History: Past Medical History:  Diagnosis Date   GERD (gastroesophageal reflux disease)    Seasonal allergies    Ventral hernia    Umbilical, supraumbilical, and epigastric hernias     Past Surgical History: Past Surgical History:  Procedure Laterality Date   INSERTION OF MESH  06/11/2022   ROBOTIC ASSISTED LAPAROSCOPIC VENTRAL/INCISIONAL HERNIA REPAIR  06/11/2022    Home Medications: Prior to Admission medications   Medication Sig Start Date End Date Taking? Authorizing Provider  acetaminophen (TYLENOL) 500 MG tablet Take 2 tablets (1,000 mg total) by mouth every 6 (six) hours as needed for mild pain. 06/11/22  Yes Iran Kievit, Jacqulyn Bath, MD  ibuprofen (ADVIL) 200 MG tablet Take 400 mg by mouth every 6 (six) hours as needed for mild pain.   Yes [provider]    Allergies: No Known Allergies  Review of Systems: Review of Systems  Constitutional:  Negative for chills and fever.  Respiratory:  Negative for shortness of breath.   Cardiovascular:  Negative for chest pain.  Gastrointestinal:  Positive for abdominal pain. Negative for nausea and vomiting.  Skin:  Negative for rash.    Physical Exam BP (!) 146/82   Pulse 94   Temp 98.7 F (37.1 C) (Oral)   Ht 5\' 8"  (1.727 m)   Wt (!) 351 lb 9.6 oz (159.5 kg)   SpO2 96%   BMI 53.46 kg/m  CONSTITUTIONAL: No acute distress HEENT:  Normocephalic, atraumatic, extraocular motion intact. RESPIRATORY:  Normal respiratory effort without pathologic use of accessory  muscles. CARDIOVASCULAR: Regular rhythm and rate. GI: The abdomen is soft, obese, non-distended, appropriately sore to palpation.  Incisions are clean, dry, intact.  No evidence of recurrent hernias.  Some palpable firmness over midline consistent with scar tissue hernia repair.  NEUROLOGIC:  Motor and sensation is grossly normal.  Cranial nerves are grossly intact. PSYCH:  Alert and oriented to person, place and time. Affect is normal.  Assessment and Plan: This is a 27 y.o. male s/p robotic assisted repair of umbilical, supraumbilical, and epigastric hernias.  --Discussed with the patient that it is normal to still have soreness particularly depending on his activity level at this point.  He is only two weeks out and the length of his repair was 14 cm.  Overall there is no evidence of hernia recurrence at this point and he's healing appropriately. --Recommended that he continue wearing his abdominal binder for another two weeks, and continue with activity restrictions for a total of 6 weeks from surgery. --Follow up as needed.  I spent 20 minutes dedicated to the care of this patient on the date of this encounter to include pre-visit review of records, face-to-face time with the patient discussing diagnosis and management, and any post-visit coordination of care.   Melvyn Neth, [facility]

## 2022-06-26 NOTE — Patient Instructions (Signed)

## 2022-10-10 IMAGING — DX DG CHEST 2V
2 series · 2 of 2 positions shown · non-contrast
Comparison: None.

CLINICAL DATA: Productive cough, fever.

EXAM:
CHEST - 2 VIEW

[chest lat]
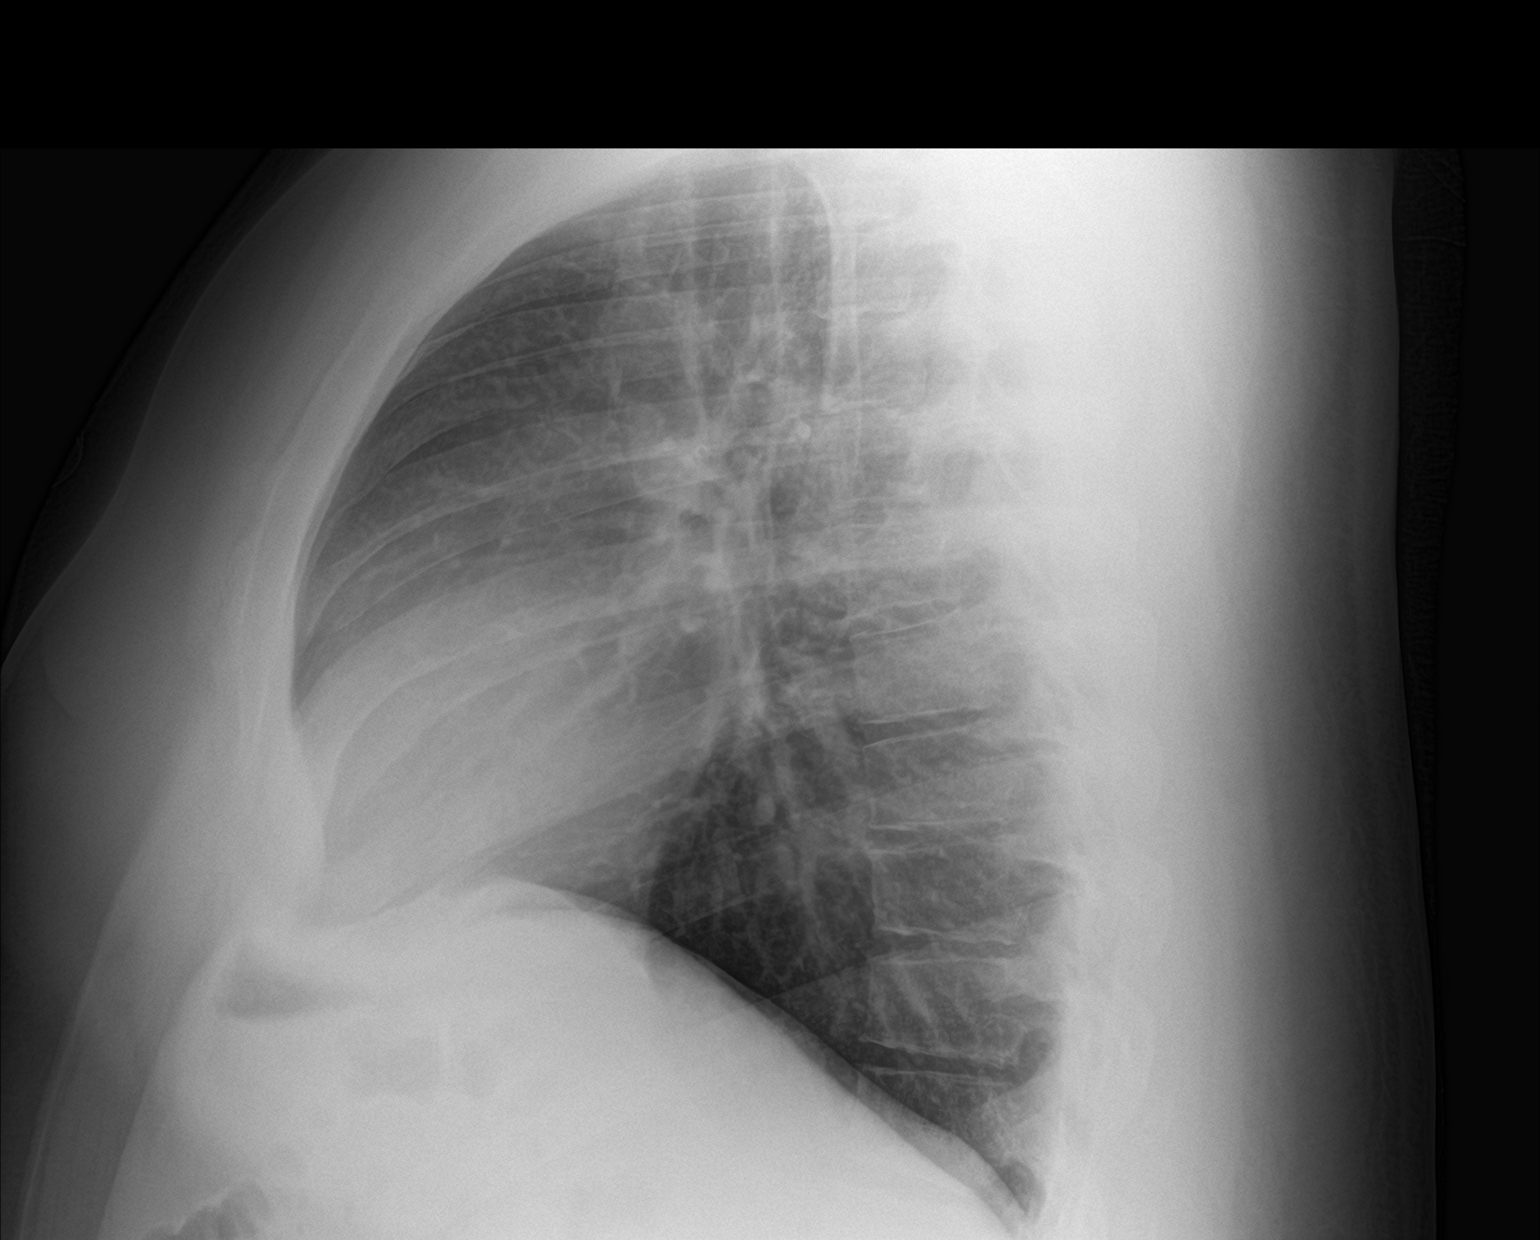

[chest pa]
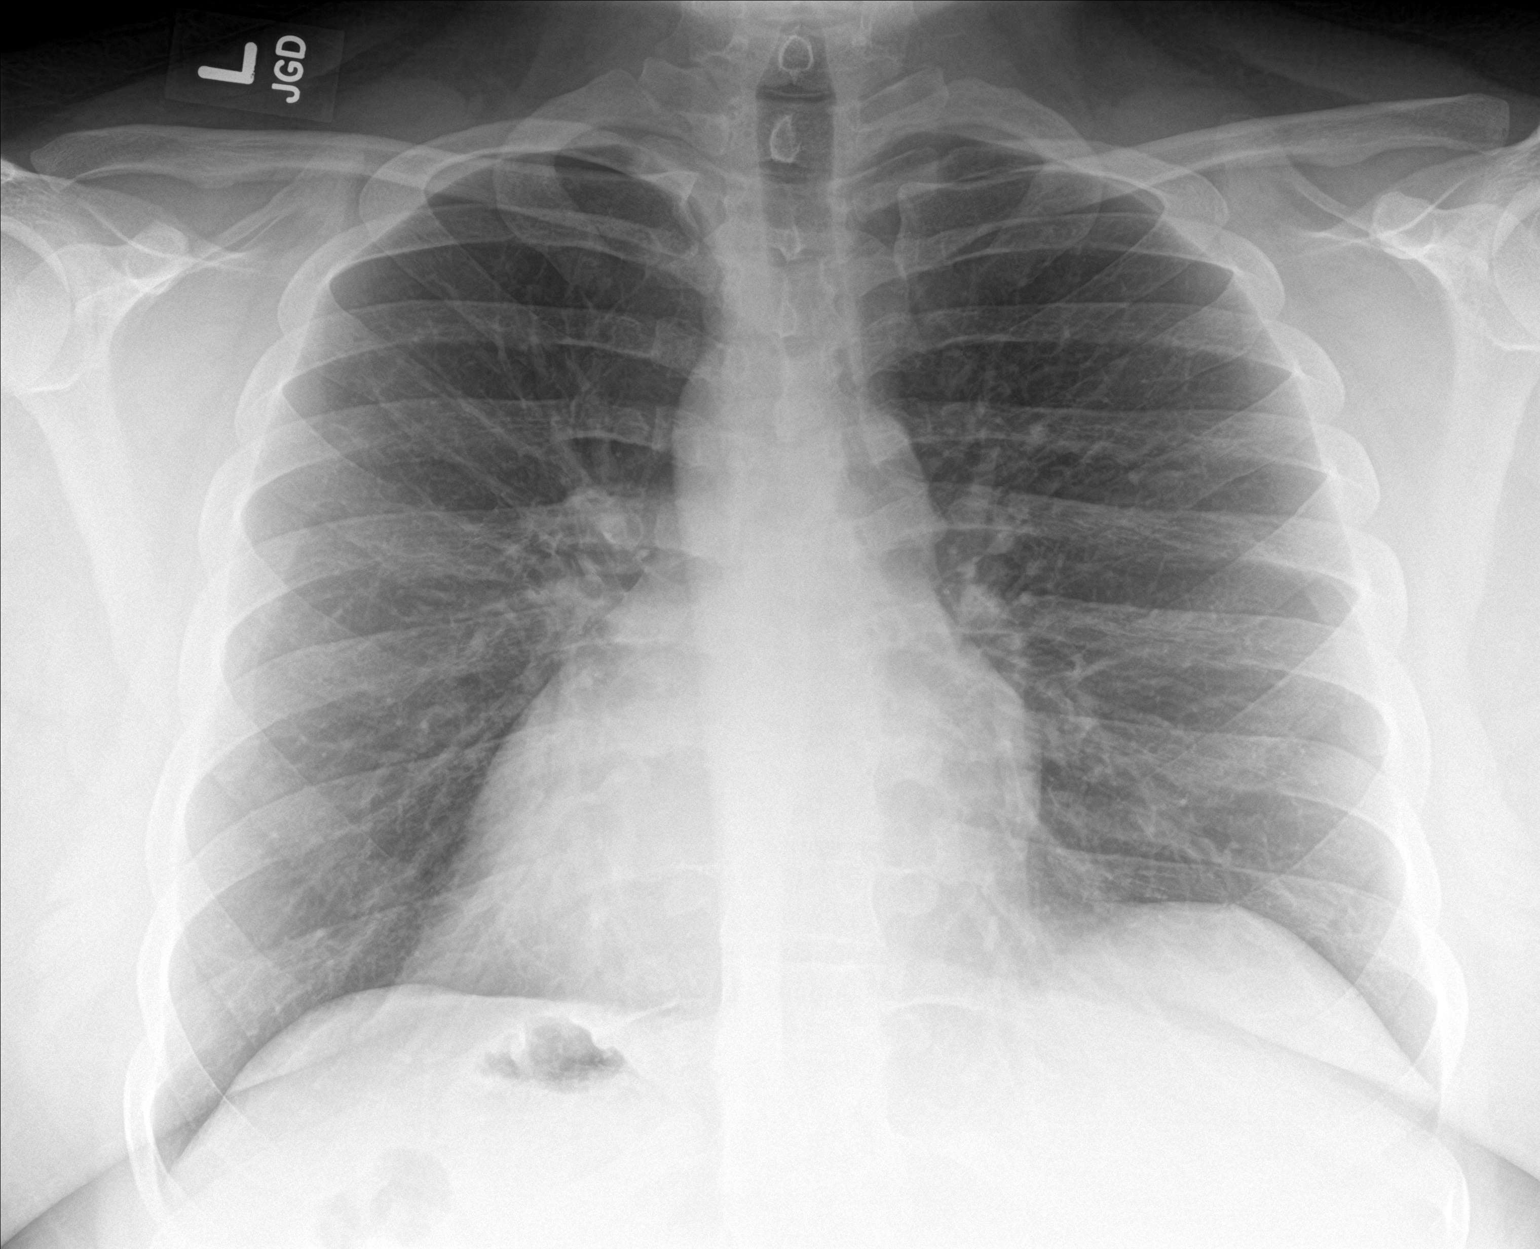

[2 of 2 positions shown; findings below may reference images not displayed]

FINDINGS: The heart size and mediastinal contours are within normal limits.
Both lungs are clear. The visualized skeletal structures are
unremarkable.
IMPRESSION: No active cardiopulmonary disease.

## 2023-04-02 ENCOUNTER — Encounter (HOSPITAL_COMMUNITY): Payer: Self-pay | Admitting: Emergency Medicine

## 2023-04-02 ENCOUNTER — Ambulatory Visit (HOSPITAL_COMMUNITY)
Admission: EM | Admit: 2023-04-02 | Discharge: 2023-04-02 | Disposition: A | Discharge status: Home / Self Care | Payer: Medicaid Other | Attending: Sports Medicine | Admitting: Sports Medicine

## 2023-04-02 DIAGNOSIS — B084 Enteroviral vesicular stomatitis with exanthem: Secondary | ICD-10-CM

## 2023-04-02 DIAGNOSIS — R21 Rash and other nonspecific skin eruption: Secondary | ICD-10-CM | POA: Diagnosis not present

## 2023-04-02 MED ORDER — TRIAMCINOLONE ACETONIDE 0.1 % EX CREA: 1.0000 | TOPICAL_CREAM | Freq: Two times a day (BID) | CUTANEOUS | 0 refills | Status: DC

## 2023-04-02 NOTE — Discharge Instructions (Signed)
Your rash is consistent with Hand Foot Mouth Disease (HFMD). This is a self limited condition meaning it should resolve in the next 7-10 days. Treatment is supportive and I have sent a steroid cream to your pharmacy to use twice daily on the affected spots. Discontinue after 7 days. This is a contagious condition and can particularly impact kids worse, so please practice good hand hygiene and disinfecting contaminated surfaces to reduce the spread of the virus.

## 2023-04-02 NOTE — ED Triage Notes (Signed)
Pt presents with rash on hands and feet that he first noticed Monday. Pt states he does have sensitive skin but never like this. Rash is very itchy and he has tried benadryl cream.

## 2023-04-02 NOTE — ED Provider Notes (Signed)
[facility]    CSN: [id number] Arrival date & time: 04/02/23  0932      History   Chief Complaint Chief Complaint  Patient presents with   Rash    HPI Brevon Dewald is a 27 y.o. male.   Krishon is a 27 year old male here for evaluation of a rash that has developed on his hands and his feet over the past 3 days.  It is intensely pruritic for him.  It started as an initial lesion on the inside of his index finger though has developed to involve both the dorsal and volar aspect of bilateral hands and feet.  He denies pain, fevers, chest discomfort, oral lesions.  He does have a son at home though based on Candice's report it sounds as though no one else at the home is having any of the same lesions.   Rash   Past Medical History:  Diagnosis Date   GERD (gastroesophageal reflux disease)    Seasonal allergies    Ventral hernia    Umbilical, supraumbilical, and epigastric hernias    Patient Active Problem List   Diagnosis Date Noted   Incarcerated umbilical hernia 06/11/2022   Incarcerated epigastric hernia 06/11/2022    Past Surgical History:  Procedure Laterality Date   INSERTION OF MESH  06/11/2022   ROBOTIC ASSISTED LAPAROSCOPIC VENTRAL/INCISIONAL HERNIA REPAIR  06/11/2022       Home Medications    Prior to Admission medications   Medication Sig Start Date End Date Taking? Authorizing Provider  triamcinolone cream (KENALOG) 0.1 % Apply 1 Application topically 2 (two) times daily. 04/02/23  Yes Marisa Cyphers, MD  acetaminophen (TYLENOL) 500 MG tablet Take 2 tablets (1,000 mg total) by mouth every 6 (six) hours as needed for mild pain. 06/11/22   Henrene Dodge, MD  ibuprofen (ADVIL) 200 MG tablet Take 400 mg by mouth every 6 (six) hours as needed for mild pain.    [provider]    Family History Family History  Problem Relation Age of Onset   Healthy Mother     Social History Social History   Tobacco Use   Smoking status: Former     Types: Cigarettes    Passive exposure: Past   Smokeless tobacco: Never  Vaping Use   Vaping status: Never Used  Substance Use Topics   Alcohol use: Never   Drug use: Not Currently    Types: Marijuana     Allergies   Patient has no known allergies.   Review of Systems Review of Systems  Skin:  Positive for rash.     Physical Exam Triage Vital Signs ED Triage Vitals  Encounter Vitals Group     BP 04/02/23 0912 (!) 162/100     Systolic BP Percentile --      Diastolic BP Percentile --      Pulse Rate 04/02/23 0912 70     Resp 04/02/23 1012 16     Temp 04/02/23 1012 97.9 F (36.6 C)     Temp Source 04/02/23 1012 Oral     SpO2 04/02/23 1012 95 %     Weight --      Height --      Head Circumference --      Peak Flow --      Pain Score --      Pain Loc --      Pain Education --      Exclude from Growth Chart --    No data found.  Updated Vital Signs BP (!) 151/98 (BP Location: Right Arm)   Pulse 74   Temp 97.9 F (36.6 C) (Oral)   Resp 16   SpO2 95%   Visual Acuity Right Eye Distance:   Left Eye Distance:   Bilateral Distance:    Right Eye Near:   Left Eye Near:    Bilateral Near:     Physical Exam Constitutional:      General: He is not in acute distress.    Appearance: Normal appearance. He is obese. He is not ill-appearing.  Cardiovascular:     Rate and Rhythm: Normal rate.     Pulses: Normal pulses.  Pulmonary:     Effort: Pulmonary effort is normal.  Skin:    Comments: Diffuse erythemic papular pustule lesions on both the dorsal and volar aspect of his hands and the medial aspect of his feet and between his toes bilaterally.  See described below.  No lesions in his oropharynx.  Lesions isolated to hands and feet remainder of his upper extremities and lower extremities are uninvolved.  Neurological:     Mental Status: He is alert.       Media Information     Media Information      UC Treatments / Results  Labs (all labs ordered  are listed, but only abnormal results are displayed) Labs Reviewed - No data to display  EKG   Radiology No results found.  Procedures Procedures (including critical care time)  Medications Ordered in UC Medications - No data to display  Initial Impression / Assessment and Plan / UC Course  I have reviewed the triage vital signs and the nursing notes.  Pertinent labs & imaging results that were available during my care of the patient were reviewed by me and considered in my medical decision making (see chart for details).    Vitals and triage reviewed, patient is hemodynamically stable.  This rash is most consistent with hand-foot mouth disease.  We discussed the self-limited nature of this condition as well as its contagious potential.  Advised supportive care and did prescribe him triamcinolone to use twice daily for the course of the next week for his itching.  He has had no benefit with the Benadryl cream though may continue this on a as needed basis as well.  Work note provided.  His questions were answered he is agreement with this plan.  Final Clinical Impressions(s) / UC Diagnoses   Final diagnoses:  Rash  Hand, foot and mouth disease (HFMD)     Discharge Instructions      Your rash is consistent with Hand Foot Mouth Disease (HFMD). This is a self limited condition meaning it should resolve in the next 7-10 days. Treatment is supportive and I have sent a steroid cream to your pharmacy to use twice daily on the affected spots. Discontinue after 7 days. This is a contagious condition and can particularly impact kids worse, so please practice good hand hygiene and disinfecting contaminated surfaces to reduce the spread of the virus.   ED Prescriptions     Medication Sig Dispense Auth. Provider   triamcinolone cream (KENALOG) 0.1 % Apply 1 Application topically 2 (two) times daily. 30 g Marisa Cyphers, MD      PDMP not reviewed this encounter.   Marisa Cyphers, MD 04/02/23 [phone]

## 2024-01-23 ENCOUNTER — Encounter (HOSPITAL_COMMUNITY): Payer: Self-pay

## 2024-01-23 ENCOUNTER — Ambulatory Visit (HOSPITAL_COMMUNITY)
Admission: EM | Admit: 2024-01-23 | Discharge: 2024-01-23 | Disposition: A | Discharge status: Home / Self Care | Attending: Family Medicine | Admitting: Family Medicine

## 2024-01-23 DIAGNOSIS — Z202 Contact with and (suspected) exposure to infections with a predominantly sexual mode of transmission: Secondary | ICD-10-CM | POA: Diagnosis present

## 2024-01-23 LAB — HIV ANTIBODY (ROUTINE TESTING W REFLEX): HIV Screen 4th Generation wRfx: NONREACTIVE

## 2024-01-23 NOTE — ED Triage Notes (Signed)
 Patient is here for STD testing. Pt states he has been exposed  to trich a month ago. Denies symptoms.

## 2024-01-23 NOTE — Discharge Instructions (Signed)
 Staff will notify you if there is anything positive on the swab (or on the blood work if that has been taken at this visit). It can take 2-3 days for the tests to result, depending on the day of the week your test was taken. You will only be notified if there are any positives on the testing; test results will also go to your MyChart if you are signed up for MyChart.

## 2024-01-23 NOTE — ED Provider Notes (Signed)
 [facility]    CSN: [id number] Arrival date & time: 01/23/24  1314      History   Chief Complaint Chief Complaint  Patient presents with   Exposure to STD    HPI Lance Jensen is a 28 y.o. male.    Exposure to STD  Here for STD testing.  He was possibly exposed to trichomoniasis about a month or so ago.  He does not have any symptoms.  No penile discharge or itching or dysuria.  He would like to also be tested for HIV and syphilis.  He asks about testing for HSV-2.  He does not have any rash or painful blisters or ulcers.  NKDA  Past Medical History:  Diagnosis Date   GERD (gastroesophageal reflux disease)    Seasonal allergies    Ventral hernia    Umbilical, supraumbilical, and epigastric hernias    Patient Active Problem List   Diagnosis Date Noted   Incarcerated umbilical hernia 06/11/2022   Incarcerated epigastric hernia 06/11/2022    Past Surgical History:  Procedure Laterality Date   INSERTION OF MESH  06/11/2022   ROBOTIC ASSISTED LAPAROSCOPIC VENTRAL/INCISIONAL HERNIA REPAIR  06/11/2022       Home Medications    Prior to Admission medications   Not on File    Family History Family History  Problem Relation Age of Onset   Healthy Mother     Social History Social History   Tobacco Use   Smoking status: Former    Types: Cigarettes    Passive exposure: Past   Smokeless tobacco: Never  Vaping Use   Vaping status: Never Used  Substance Use Topics   Alcohol use: Never   Drug use: Yes    Types: Marijuana     Allergies   Patient has no known allergies.   Review of Systems Review of Systems   Physical Exam Triage Vital Signs ED Triage Vitals [01/23/24 1330]  Encounter Vitals Group     BP (!) 135/95     Girls Systolic BP Percentile      Girls Diastolic BP Percentile      Boys Systolic BP Percentile      Boys Diastolic BP Percentile      Pulse Rate 83     Resp 16     Temp 99 F (37.2 C)     Temp Source Oral      SpO2 95 %     Weight      Height      Head Circumference      Peak Flow      Pain Score 0     Pain Loc      Pain Education      Exclude from Growth Chart    No data found.  Updated Vital Signs BP (!) 135/95 (BP Location: Right Arm)   Pulse 83   Temp 99 F (37.2 C) (Oral)   Resp 16   SpO2 95%   Visual Acuity Right Eye Distance:   Left Eye Distance:   Bilateral Distance:    Right Eye Near:   Left Eye Near:    Bilateral Near:     Physical Exam Vitals reviewed.  Constitutional:      General: He is not in acute distress.    Appearance: He is not ill-appearing, toxic-appearing or diaphoretic.  Skin:    Coloration: Skin is not pale.  Neurological:     Mental Status: He is alert and oriented to person, place, and time.  Psychiatric:        Behavior: Behavior normal.      UC Treatments / Results  Labs (all labs ordered are listed, but only abnormal results are displayed) Labs Reviewed  RPR  HIV ANTIBODY (ROUTINE TESTING W REFLEX)  CYTOLOGY, (ORAL, ANAL, URETHRAL) ANCILLARY ONLY    EKG   Radiology No results found.  Procedures Procedures (including critical care time)  Medications Ordered in UC Medications - No data to display  Initial Impression / Assessment and Plan / UC Course  I have reviewed the triage vital signs and the nursing notes.  Pertinent labs & imaging results that were available during my care of the patient were reviewed by me and considered in my medical decision making (see chart for details).     Blood work is drawn to check HIV and RPR.  Urethral self swab is done and staff will notify him of any positives on that are on the blood work, and treat per protocol.  I discussed with him that it is not recommended to check antibodies for HSV-2, because a positive antibody test does not necessarily mean that he would ever have an outbreak.  He is to return if he does ever have a painful genital rash, so we can test him appropriately  with a swab.  Final Clinical Impressions(s) / UC Diagnoses   Final diagnoses:  Possible exposure to STD     Discharge Instructions      Staff will notify you if there is anything positive on the swab (or on the blood work if that has been taken at this visit). It can take 2-3 days for the tests to result, depending on the day of the week your test was taken. You will only be notified if there are any positives on the testing; test results will also go to your MyChart if you are signed up for MyChart.        ED Prescriptions   None    PDMP not reviewed this encounter.   Vonna Sharlet POUR, MD 01/23/24 1351

## 2024-01-24 LAB — RPR: RPR Ser Ql: NONREACTIVE

## 2024-01-27 LAB — CYTOLOGY, (ORAL, ANAL, URETHRAL) ANCILLARY ONLY
Chlamydia: NEGATIVE
Comment: NEGATIVE
Comment: NEGATIVE
Comment: NORMAL
Neisseria Gonorrhea: NEGATIVE
Trichomonas: POSITIVE — AB

## 2024-01-28 ENCOUNTER — Ambulatory Visit (HOSPITAL_COMMUNITY): Payer: Self-pay

## 2024-01-28 MED ORDER — METRONIDAZOLE 500 MG PO TABS: 2000.0000 mg | ORAL_TABLET | Freq: Once | ORAL | 0 refills | Status: AC
